# Patient Record
Sex: Female | Born: 1995 | Hispanic: No | Marital: Married | Smoking: Never smoker
Health system: Southern US, Community
[De-identification: ages and names within clinical notes are randomized; demographics above are authoritative.]

## PROBLEM LIST (undated history)

## (undated) DIAGNOSIS — E611 Iron deficiency: Secondary | ICD-10-CM

## (undated) DIAGNOSIS — M1990 Unspecified osteoarthritis, unspecified site: Secondary | ICD-10-CM

---
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
## 2014-06-05 ENCOUNTER — Emergency Department (HOSPITAL_COMMUNITY): Payer: BLUE CROSS/BLUE SHIELD

## 2014-06-05 ENCOUNTER — Emergency Department (HOSPITAL_COMMUNITY)
Admission: EM | Admit: 2014-06-05 | Discharge: 2014-06-05 | Disposition: A | Discharge status: Home / Self Care | Payer: BLUE CROSS/BLUE SHIELD | Attending: Emergency Medicine | Admitting: Emergency Medicine

## 2014-06-05 ENCOUNTER — Encounter (HOSPITAL_COMMUNITY): Payer: Self-pay | Admitting: *Deleted

## 2014-06-05 DIAGNOSIS — Z8739 Personal history of other diseases of the musculoskeletal system and connective tissue: Secondary | ICD-10-CM | POA: Diagnosis not present

## 2014-06-05 DIAGNOSIS — Y92008 Other place in unspecified non-institutional (private) residence as the place of occurrence of the external cause: Secondary | ICD-10-CM | POA: Diagnosis not present

## 2014-06-05 DIAGNOSIS — X58XXXA Exposure to other specified factors, initial encounter: Secondary | ICD-10-CM | POA: Diagnosis not present

## 2014-06-05 DIAGNOSIS — S4991XA Unspecified injury of right shoulder and upper arm, initial encounter: Secondary | ICD-10-CM | POA: Insufficient documentation

## 2014-06-05 DIAGNOSIS — Y9389 Activity, other specified: Secondary | ICD-10-CM | POA: Insufficient documentation

## 2014-06-05 DIAGNOSIS — Y998 Other external cause status: Secondary | ICD-10-CM | POA: Diagnosis not present

## 2014-06-05 HISTORY — DX: Unspecified osteoarthritis, unspecified site: M19.90

## 2014-06-05 MED ORDER — CYCLOBENZAPRINE HCL 10 MG PO TABS: 10.0000 mg | ORAL_TABLET | Freq: Two times a day (BID) | ORAL | Status: DC | PRN

## 2014-06-05 MED ORDER — HYDROCODONE-ACETAMINOPHEN 5-325 MG PO TABS
2.0000 | ORAL_TABLET | Freq: Once | ORAL | Status: AC
  Administered 2014-06-05: 2 via ORAL
  Filled 2014-06-05: qty 2

## 2014-06-05 MED ORDER — MELOXICAM 15 MG PO TABS: 15.0000 mg | ORAL_TABLET | Freq: Every day | ORAL | Status: DC

## 2014-06-05 NOTE — Discharge Instructions (Signed)
Take Mobic as needed for pain. Take flexeril as needed for muscle spasm. You may take these medications together. Refer to attached documents for more information.  °

## 2014-06-05 NOTE — ED Provider Notes (Signed)
CSN: [id number]     Arrival date & time 06/05/14  1313 History  This chart was scribed for non-physician practitioner, Emilia BeckKaitlyn Jessyca Sloan, working with Samuel JesterKathleen McManus, DO by Richarda Overlieichard Holland, ED Scribe. This patient was seen in room TR03C/TR03C and the patient's care was started at 3:38 PM.  Chief Complaint  Patient presents with  . Shoulder Pain   The history is provided by the patient. No language interpreter was used.   HPI Comments: Gabrielle Erickson is a 19 y.o. female with a history of arthritis who presents to the Emergency Department complaining of right shoulder pain for the last 2 days. Pt states she was moving to a new house when she felt a pull and heard a pop in her right shoulder while moving something. She denies any other known injuries. Pt states that she has taken ibuprofen 800mg  and naproxen at home with no relief. She states her pain worsens with certain movements such as raising her right arm upwards. She denies pain in her elbow.   Past Medical History  Diagnosis Date  . Arthritis    History reviewed. No pertinent past surgical history. No family history on file. History  Substance Use Topics  . Smoking status: Never Smoker   . Smokeless tobacco: Not on file  . Alcohol Use: Not on file   OB History    No data available     Review of Systems  Musculoskeletal: Positive for arthralgias.  All other systems reviewed and are negative.  Allergies  Review of patient's allergies indicates no known allergies.  Home Medications   Prior to Admission medications   Not on File   BP 103/81 mmHg  Pulse 68  Temp(Src) 98.2 F (36.8 C) (Oral)  Resp 16  Ht 5\' 3"  (1.6 m)  Wt 215 lb 5 oz (97.665 kg)  BMI 38.15 kg/m2  SpO2 98%  LMP 06/05/2014 Physical Exam  Constitutional: She is oriented to person, place, and time. She appears well-developed and well-nourished.  HENT:  Head: Normocephalic and atraumatic.  Eyes: Right eye exhibits no discharge. Left eye exhibits no  discharge.  Neck: Neck supple. No tracheal deviation present.  Cardiovascular: Normal rate and regular rhythm.   Pulmonary/Chest: Effort normal. No respiratory distress.  Abdominal: Soft. She exhibits no distension.  Musculoskeletal: She exhibits tenderness.  Generalized right shoulder TTP. No obvious joint deformity. Limited ROM due to pain.   Neurological: She is alert and oriented to person, place, and time. Coordination normal.  Skin: Skin is warm and dry.  Psychiatric: She has a normal mood and affect. Her behavior is normal.  Nursing note and vitals reviewed.   ED Course  Procedures   DIAGNOSTIC STUDIES: Oxygen Saturation is 98% on RA, normal by my interpretation.    COORDINATION OF CARE: 3:41 PM Discussed treatment plan with pt at bedside and pt agreed to plan.   Labs Review Labs Reviewed - No data to display  Imaging Review Dg Shoulder Right  06/05/2014   CLINICAL DATA:  Right shoulder pain since moving 2 days ago.  EXAM: RIGHT SHOULDER - 2+ VIEW  COMPARISON:  None.  FINDINGS: There is no evidence of fracture or dislocation. There is no evidence of arthropathy or other focal bone abnormality. Soft tissues are unremarkable.  IMPRESSION: Negative.   Electronically Signed   By: Charlett NoseKevin  Dover M.D.   On: 06/05/2014 15:18     EKG Interpretation None      MDM   Final diagnoses:  Right shoulder injury, initial  encounter   Xray unremarkable. Patient will have flexeril and mobic for pain. No neurovascular compromise.   I personally performed the services described in this documentation, which was scribed in my presence. The recorded information has been reviewed and is accurate.      Emilia Beck, PA-C 06/07/14 0515  Samuel Jester, DO 06/08/14 2110

## 2014-06-05 NOTE — ED Notes (Signed)
Pt states that she has had rt shoulder pain since she was moving 2 days ago. Pt states that the woke up the next morning after moving with the pain.

## 2014-06-19 ENCOUNTER — Emergency Department (HOSPITAL_COMMUNITY)
Admission: EM | Admit: 2014-06-19 | Discharge: 2014-06-19 | Disposition: A | Discharge status: Home / Self Care | Payer: BLUE CROSS/BLUE SHIELD | Attending: Emergency Medicine | Admitting: Emergency Medicine

## 2014-06-19 ENCOUNTER — Encounter (HOSPITAL_COMMUNITY): Payer: Self-pay | Admitting: Physical Medicine and Rehabilitation

## 2014-06-19 DIAGNOSIS — Z880 Allergy status to penicillin: Secondary | ICD-10-CM | POA: Insufficient documentation

## 2014-06-19 DIAGNOSIS — M25511 Pain in right shoulder: Secondary | ICD-10-CM

## 2014-06-19 MED ORDER — CYCLOBENZAPRINE HCL 10 MG PO TABS: 10.0000 mg | ORAL_TABLET | Freq: Two times a day (BID) | ORAL | Status: DC | PRN

## 2014-06-19 MED ORDER — MELOXICAM 15 MG PO TABS: 15.0000 mg | ORAL_TABLET | Freq: Every day | ORAL | Status: DC

## 2014-06-19 NOTE — ED Notes (Signed)
Pt presents to department for evaluation of R shoulder pain. Was seen for same recently. Now reports increased pain, lifts heavy equipment frequently at work. 5/10 pain upon arrival to ED. No obvious deformity noted. Pt is alert and oriented x4.

## 2014-06-19 NOTE — Discharge Instructions (Signed)
Take mobic as needed for pain. Take flexeril for muscle spasm. Follow up with Dr. Eulah PontMurphy for further evaluation.

## 2014-06-19 NOTE — ED Notes (Signed)
Patient states she didn't get a note last time restricting use of her arm, so she lifts heavy objects at work and "it hasn't healed".   Patient states that she needs note to "let me work light duty, so I don't have to use my arm".

## 2014-06-19 NOTE — ED Provider Notes (Signed)
CSN: [id number]     Arrival date & time 06/19/14  1156 History   This chart was scribed for Emilia BeckKaitlyn Zalika Tieszen, PA-C working with Bethann BerkshireJoseph Zammit, MD by Evon Slackerrance Branch, ED Scribe. This patient was seen in room TR10C/TR10C and the patient's care was started at 12:21 PM.     Chief Complaint  Patient presents with  . Shoulder Pain   Patient is a 19 y.o. female presenting with shoulder pain. The history is provided by the patient. No language interpreter was used.  Shoulder Pain  HPI Comments: Gabrielle Erickson is a 19 y.o. female who presents to the Emergency Department complaining of right shoulder pain onset 06/03/14. Pt doesn't report any associated symptoms. Pt states that the pain is worse with movement. Pt states that her shoulder is not healing due to not being able to rest her arm while at work. Pt is requesting a work note for light duty. Pt denies any other symptoms.   Past Medical History  Diagnosis Date  . Arthritis    History reviewed. No pertinent past surgical history. No family history on file. History  Substance Use Topics  . Smoking status: Never Smoker   . Smokeless tobacco: Not on file  . Alcohol Use: No   OB History    No data available      Review of Systems  Musculoskeletal: Positive for arthralgias.  All other systems reviewed and are negative.    Allergies  Penicillins  Home Medications   Prior to Admission medications   Medication Sig Start Date End Date Taking? Authorizing Provider  cyclobenzaprine (FLEXERIL) 10 MG tablet Take 1 tablet (10 mg total) by mouth 2 (two) times daily as needed for muscle spasms. 06/05/14   Kamla Skilton, PA-C  meloxicam (MOBIC) 15 MG tablet Take 1 tablet (15 mg total) by mouth daily. 06/05/14   Berl Bonfanti, PA-C   BP 105/65 mmHg  Pulse 86  Temp(Src) 98.8 F (37.1 C) (Oral)  Resp 18  SpO2 99%  LMP 06/05/2014   Physical Exam  Constitutional: She is oriented to person, place, and time. She appears  well-developed and well-nourished. No distress.  HENT:  Head: Normocephalic and atraumatic.  Eyes: Conjunctivae and EOM are normal.  Neck: Neck supple. No tracheal deviation present.  Cardiovascular: Normal rate.   Pulmonary/Chest: Effort normal. No respiratory distress.  Abdominal: Soft. She exhibits no distension. There is no tenderness. There is no rebound.  Musculoskeletal: Normal range of motion. She exhibits tenderness.  right shoulder generalized tenderness to palpation, no obvious deformity.   Neurological: She is alert and oriented to person, place, and time. Coordination normal.  Skin: Skin is warm and dry.  Psychiatric: She has a normal mood and affect. Her behavior is normal.  Nursing note and vitals reviewed.   ED Course  Procedures (including critical care time) DIAGNOSTIC STUDIES: Oxygen Saturation is 99% on RA, normal by my interpretation.    COORDINATION OF CARE: 1:24 PM-Discussed treatment plan with pt at bedside and pt agreed to plan.     Labs Review Labs Reviewed - No data to display  Imaging Review No results found.   EKG Interpretation None      MDM   Final diagnoses:  Right shoulder pain   Patient will have work note for light duty and additional pain medication. Patient will have Ortho follow up for further evaluation.    I personally performed the services described in this documentation, which was scribed in my presence. The recorded information has  been reviewed and is accurate.      Emilia BeckKaitlyn Chalyn Amescua, PA-C 06/20/14 1059  Bethann BerkshireJoseph Zammit, MD 06/20/14 1451

## 2015-09-03 ENCOUNTER — Emergency Department (HOSPITAL_COMMUNITY)
Admission: EM | Admit: 2015-09-03 | Discharge: 2015-09-04 | Disposition: A | Discharge status: Home / Self Care | Payer: BLUE CROSS/BLUE SHIELD | Attending: Emergency Medicine | Admitting: Emergency Medicine

## 2015-09-03 ENCOUNTER — Emergency Department (HOSPITAL_COMMUNITY): Payer: BLUE CROSS/BLUE SHIELD

## 2015-09-03 ENCOUNTER — Encounter (HOSPITAL_COMMUNITY): Payer: Self-pay | Admitting: Emergency Medicine

## 2015-09-03 DIAGNOSIS — Y999 Unspecified external cause status: Secondary | ICD-10-CM | POA: Diagnosis not present

## 2015-09-03 DIAGNOSIS — S4991XA Unspecified injury of right shoulder and upper arm, initial encounter: Secondary | ICD-10-CM | POA: Diagnosis not present

## 2015-09-03 DIAGNOSIS — S7012XA Contusion of left thigh, initial encounter: Secondary | ICD-10-CM | POA: Insufficient documentation

## 2015-09-03 DIAGNOSIS — Y92488 Other paved roadways as the place of occurrence of the external cause: Secondary | ICD-10-CM | POA: Insufficient documentation

## 2015-09-03 DIAGNOSIS — S79922A Unspecified injury of left thigh, initial encounter: Secondary | ICD-10-CM | POA: Diagnosis present

## 2015-09-03 DIAGNOSIS — Y9389 Activity, other specified: Secondary | ICD-10-CM | POA: Insufficient documentation

## 2015-09-03 MED ORDER — IBUPROFEN 400 MG PO TABS
600.0000 mg | ORAL_TABLET | Freq: Once | ORAL | Status: AC
  Administered 2015-09-03: 600 mg via ORAL
  Filled 2015-09-03: qty 1

## 2015-09-03 MED ORDER — HYDROCODONE-ACETAMINOPHEN 5-325 MG PO TABS: 2.0000 | ORAL_TABLET | ORAL | 0 refills | Status: DC | PRN

## 2015-09-03 MED ORDER — IBUPROFEN 600 MG PO TABS: 600.0000 mg | ORAL_TABLET | Freq: Four times a day (QID) | ORAL | 0 refills | Status: DC | PRN

## 2015-09-03 MED ORDER — METHOCARBAMOL 500 MG PO TABS: 500.0000 mg | ORAL_TABLET | Freq: Every evening | ORAL | 0 refills | Status: DC | PRN
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
## 2015-09-04 NOTE — ED Notes (Signed)
Pt verbalized understanding of d/c instructions and has no further questions. Pt stable and NAD. Pt d/c home with mother driving. Pt removed all belongings from room.

## 2015-12-29 ENCOUNTER — Emergency Department: Payer: No Typology Code available for payment source

## 2015-12-29 ENCOUNTER — Emergency Department
Admission: EM | Admit: 2015-12-29 | Discharge: 2015-12-29 | Disposition: A | Discharge status: Home / Self Care | Payer: No Typology Code available for payment source | Attending: Emergency Medicine | Admitting: Emergency Medicine

## 2015-12-29 DIAGNOSIS — Y929 Unspecified place or not applicable: Secondary | ICD-10-CM | POA: Diagnosis not present

## 2015-12-29 DIAGNOSIS — S8991XA Unspecified injury of right lower leg, initial encounter: Secondary | ICD-10-CM | POA: Diagnosis present

## 2015-12-29 DIAGNOSIS — Y999 Unspecified external cause status: Secondary | ICD-10-CM | POA: Insufficient documentation

## 2015-12-29 DIAGNOSIS — Z791 Long term (current) use of non-steroidal anti-inflammatories (NSAID): Secondary | ICD-10-CM | POA: Insufficient documentation

## 2015-12-29 DIAGNOSIS — Y9389 Activity, other specified: Secondary | ICD-10-CM | POA: Diagnosis not present

## 2015-12-29 DIAGNOSIS — S8001XA Contusion of right knee, initial encounter: Secondary | ICD-10-CM | POA: Insufficient documentation

## 2015-12-29 MED ORDER — HYDROCODONE-ACETAMINOPHEN 5-325 MG PO TABS
1.0000 | ORAL_TABLET | Freq: Once | ORAL | Status: AC
  Administered 2015-12-29: 1 via ORAL
  Filled 2015-12-29: qty 1

## 2015-12-29 MED ORDER — MELOXICAM 15 MG PO TABS: 15.0000 mg | ORAL_TABLET | Freq: Every day | ORAL | 0 refills | Status: DC
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
## 2015-12-29 NOTE — ED Notes (Signed)
Pt hold her knee out straight, good strength in knee as she was holding it up.
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
## 2016-06-10 ENCOUNTER — Emergency Department
Admission: EM | Admit: 2016-06-10 | Discharge: 2016-06-10 | Disposition: A | Discharge status: Home / Self Care | Payer: BLUE CROSS/BLUE SHIELD | Attending: Emergency Medicine | Admitting: Emergency Medicine

## 2016-06-10 ENCOUNTER — Encounter: Payer: Self-pay | Admitting: Emergency Medicine

## 2016-06-10 ENCOUNTER — Emergency Department: Payer: BLUE CROSS/BLUE SHIELD

## 2016-06-10 DIAGNOSIS — X500XXA Overexertion from strenuous movement or load, initial encounter: Secondary | ICD-10-CM | POA: Insufficient documentation

## 2016-06-10 DIAGNOSIS — Y999 Unspecified external cause status: Secondary | ICD-10-CM | POA: Insufficient documentation

## 2016-06-10 DIAGNOSIS — M7581 Other shoulder lesions, right shoulder: Secondary | ICD-10-CM

## 2016-06-10 DIAGNOSIS — Y9389 Activity, other specified: Secondary | ICD-10-CM | POA: Insufficient documentation

## 2016-06-10 DIAGNOSIS — M7591 Shoulder lesion, unspecified, right shoulder: Secondary | ICD-10-CM | POA: Insufficient documentation

## 2016-06-10 DIAGNOSIS — Y929 Unspecified place or not applicable: Secondary | ICD-10-CM | POA: Insufficient documentation

## 2016-06-10 MED ORDER — IBUPROFEN 600 MG PO TABS: 600.0000 mg | ORAL_TABLET | Freq: Three times a day (TID) | ORAL | 0 refills | Status: DC | PRN

## 2016-06-10 MED ORDER — OXYCODONE-ACETAMINOPHEN 5-325 MG PO TABS
1.0000 | ORAL_TABLET | Freq: Once | ORAL | Status: AC
  Administered 2016-06-10: 1 via ORAL
  Filled 2016-06-10: qty 1

## 2016-06-10 MED ORDER — CYCLOBENZAPRINE HCL 10 MG PO TABS: 10.0000 mg | ORAL_TABLET | Freq: Three times a day (TID) | ORAL | 0 refills | Status: DC | PRN

## 2016-06-10 MED ORDER — OXYCODONE-ACETAMINOPHEN 5-325 MG PO TABS: 1.0000 | ORAL_TABLET | Freq: Four times a day (QID) | ORAL | 0 refills | Status: DC | PRN

## 2016-06-10 MED ORDER — IBUPROFEN 600 MG PO TABS
600.0000 mg | ORAL_TABLET | Freq: Once | ORAL | Status: AC
  Administered 2016-06-10: 600 mg via ORAL
  Filled 2016-06-10: qty 1

## 2016-06-10 MED ORDER — CYCLOBENZAPRINE HCL 10 MG PO TABS
10.0000 mg | ORAL_TABLET | Freq: Once | ORAL | Status: AC
  Administered 2016-06-10: 10 mg via ORAL
  Filled 2016-06-10: qty 1

## 2016-06-10 NOTE — ED Triage Notes (Signed)
Was lifting today and felt pain R shoulder.

## 2016-06-10 NOTE — ED Notes (Signed)
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

## 2016-06-10 NOTE — ED Provider Notes (Signed)
[facility] Emergency Department Provider Note   ____________________________________________   First MD Initiated Contact with Patient 06/10/16 1347     (approximate)  I have reviewed the triage vital signs and the nursing notes.   HISTORY  Chief Complaint Shoulder Pain    HPI Gabrielle Erickson is a 21 y.o. female patient complaining of right shoulder pain. Patient states approximately 3-4 days of repetitive heavy lifting to the relocation process. Patient state this morning she was assisted her mother to move dresser as she felt a "pop" in her right shoulder. Patient stated to break and she noticed that she has decreased range of motion and increased pain with adduction or attempting to reach above her head. Patient denies loss of sensation to the shoulder. Patient rates the pain as a 9/10. Patient described a pain as "sharp". No palliative measures for this complaint. Patient is right-hand dominant. Past Medical History:  Diagnosis Date  . Arthritis     There are no active problems to display for this patient.   History reviewed. No pertinent surgical history.  Prior to Admission medications   Medication Sig Start Date End Date Taking? Authorizing Provider  cyclobenzaprine (FLEXERIL) 10 MG tablet Take 1 tablet (10 mg total) by mouth 3 (three) times daily as needed. 06/10/16   Joni Reining, PA-C  HYDROcodone-acetaminophen (NORCO/VICODIN) 5-325 MG tablet Take 2 tablets by mouth every 4 (four) hours as needed. 09/03/15   Bethel Born, PA-C  ibuprofen (ADVIL,MOTRIN) 600 MG tablet Take 1 tablet (600 mg total) by mouth every 6 (six) hours as needed. 09/03/15   Bethel Born, PA-C  ibuprofen (ADVIL,MOTRIN) 600 MG tablet Take 1 tablet (600 mg total) by mouth every 8 (eight) hours as needed. 06/10/16   Joni Reining, PA-C  meloxicam (MOBIC) 15 MG tablet Take 1 tablet (15 mg total) by mouth daily. 12/29/15   Delorise Royals Cuthriell, PA-C  methocarbamol  (ROBAXIN) 500 MG tablet Take 1 tablet (500 mg total) by mouth at bedtime and may repeat dose one time if needed. 09/03/15   Bethel Born, PA-C  oxyCODONE-acetaminophen (ROXICET) 5-325 MG tablet Take 1 tablet by mouth every 6 (six) hours as needed for moderate pain. 06/10/16   Joni Reining, PA-C    Allergies Penicillins  No family history on file.  Social History Social History  Substance Use Topics  . Smoking status: Never Smoker  . Smokeless tobacco: Not on file  . Alcohol use No    Review of Systems  Constitutional: No fever/chills Eyes: No visual changes. ENT: No sore throat. Cardiovascular: Denies chest pain. Respiratory: Denies shortness of breath. Gastrointestinal: No abdominal pain.  No nausea, no vomiting.  No diarrhea.  No constipation. Genitourinary: Negative for dysuria. Musculoskeletal: Right shoulder pain. Skin: Negative for rash. Neurological: Negative for headaches, focal weakness or numbness.   ____________________________________________   PHYSICAL EXAM:  VITAL SIGNS: ED Triage Vitals  Enc Vitals Group     BP 06/10/16 1324 129/73     Pulse Rate 06/10/16 1324 71     Resp 06/10/16 1324 20     Temp 06/10/16 1324 98.3 F (36.8 C)     Temp Source 06/10/16 1324 Oral     SpO2 06/10/16 1324 99 %     Weight 06/10/16 1326 230 lb (104.3 kg)     Height 06/10/16 1326  (1.6 m)     Head Circumference --      Peak Flow --  Pain Score 06/10/16 1324 9     Pain Loc --      Pain Edu? --      Excl. in GC? --     Constitutional: Alert and oriented. Well appearing and in no acute distress. Eyes: Conjunctivae are normal. PERRL. EOMI. Head: Atraumatic. Nose: No congestion/rhinnorhea. Mouth/Throat: Mucous membranes are moist.  Oropharynx non-erythematous. Neck: No stridor.  No cervical spine tenderness to palpation. Hematological/Lymphatic/Immunilogical: No cervical lymphadenopathy. Cardiovascular: Normal rate, regular rhythm. Grossly normal heart  sounds.  Good peripheral circulation. Respiratory: Normal respiratory effort.  No retractions. Lungs CTAB. Gastrointestinal: Soft and nontender. No distention. No abdominal bruits. No CVA tenderness. Musculoskeletal: No obvious deformity to the right shoulder. Patient hosted the upper extremities in adduction.  Neurologic:  Normal speech and language. No gross focal neurologic deficits are appreciated. No gait instability. Skin:  Skin is warm, dry and intact. No rash noted. Psychiatric: Mood and affect are normal. Speech and behavior are normal.  ____________________________________________   LABS (all labs ordered are listed, but only abnormal results are displayed)  Labs Reviewed - No data to display ____________________________________________  EKG   ____________________________________________  RADIOLOGY  No acute findings x-ray of the right shoulder ____________________________________________   PROCEDURES  Procedure(s) performed: None  Procedures  Critical Care performed: No  ____________________________________________   INITIAL IMPRESSION / ASSESSMENT AND PLAN / ED COURSE  Pertinent labs & imaging results that were available during my care of the patient were reviewed by me and considered in my medical decision making (see chart for details).  Right rotator cuff tendinitis. Discussed x-ray finding with patient. Patient given discharge care instructions. Patient given a work note. Patient advised follow-up with orthopedics if no improvement in 3-5 days.      ____________________________________________   FINAL CLINICAL IMPRESSION(S) / ED DIAGNOSES  Final diagnoses:  Tendinitis of right rotator cuff      NEW MEDICATIONS STARTED DURING THIS VISIT:  New Prescriptions   CYCLOBENZAPRINE (FLEXERIL) 10 MG TABLET    Take 1 tablet (10 mg total) by mouth 3 (three) times daily as needed.   IBUPROFEN (ADVIL,MOTRIN) 600 MG TABLET    Take 1 tablet (600 mg  total) by mouth every 8 (eight) hours as needed.   OXYCODONE-ACETAMINOPHEN (ROXICET) 5-325 MG TABLET    Take 1 tablet by mouth every 6 (six) hours as needed for moderate pain.     Note:  This document was prepared using Dragon voice recognition software and may include unintentional dictation errors.    Joni Reining, PA-C 06/10/16 1429    Governor Rooks, MD 06/10/16 1540

## 2016-09-30 ENCOUNTER — Encounter: Payer: Self-pay | Admitting: Emergency Medicine

## 2016-09-30 ENCOUNTER — Emergency Department
Admission: EM | Admit: 2016-09-30 | Discharge: 2016-09-30 | Disposition: A | Discharge status: Home / Self Care | Payer: BLUE CROSS/BLUE SHIELD | Attending: Emergency Medicine | Admitting: Emergency Medicine

## 2016-09-30 DIAGNOSIS — X58XXXA Exposure to other specified factors, initial encounter: Secondary | ICD-10-CM | POA: Insufficient documentation

## 2016-09-30 DIAGNOSIS — Y929 Unspecified place or not applicable: Secondary | ICD-10-CM | POA: Insufficient documentation

## 2016-09-30 DIAGNOSIS — S29019A Strain of muscle and tendon of unspecified wall of thorax, initial encounter: Secondary | ICD-10-CM

## 2016-09-30 DIAGNOSIS — Y939 Activity, unspecified: Secondary | ICD-10-CM | POA: Insufficient documentation

## 2016-09-30 DIAGNOSIS — Z79899 Other long term (current) drug therapy: Secondary | ICD-10-CM | POA: Insufficient documentation

## 2016-09-30 DIAGNOSIS — S29012A Strain of muscle and tendon of back wall of thorax, initial encounter: Secondary | ICD-10-CM | POA: Insufficient documentation

## 2016-09-30 DIAGNOSIS — Y998 Other external cause status: Secondary | ICD-10-CM | POA: Insufficient documentation

## 2016-09-30 MED ORDER — CYCLOBENZAPRINE HCL 10 MG PO TABS: 10.0000 mg | ORAL_TABLET | Freq: Three times a day (TID) | ORAL | 0 refills | Status: DC | PRN

## 2016-09-30 MED ORDER — ORPHENADRINE CITRATE 30 MG/ML IJ SOLN: 60.0000 mg | Freq: Two times a day (BID) | INTRAMUSCULAR | Status: DC

## 2016-09-30 MED ORDER — KETOROLAC TROMETHAMINE 60 MG/2ML IM SOLN
60.0000 mg | Freq: Once | INTRAMUSCULAR | Status: AC
  Administered 2016-09-30: 60 mg via INTRAMUSCULAR
  Filled 2016-09-30: qty 2

## 2016-09-30 MED ORDER — IBUPROFEN 600 MG PO TABS: 600.0000 mg | ORAL_TABLET | Freq: Three times a day (TID) | ORAL | 0 refills | Status: DC | PRN

## 2016-09-30 NOTE — ED Notes (Signed)
See triage note  States she has had intermittent back pain/spasms for about 3 years  Denies any injury or urinary sx's.. states she has been seen for same sx's and was told she may need breast reduction  Ambulates well

## 2016-09-30 NOTE — ED Triage Notes (Signed)
Pt reports history of low back pain and muscle spasms for three years intermittently. Pt reports increasing pain and spasms over three weeks. Denies NVD. Denies dysuria.

## 2016-09-30 NOTE — ED Notes (Signed)
First Nurse Note:  Back pain which she describes as "muscle spasms".

## 2016-09-30 NOTE — ED Provider Notes (Signed)
[facility] [place] Emergency Department Provider Note   ____________________________________________   First MD Initiated Contact with Patient 09/30/16 0900     (approximate)  I have reviewed the triage vital signs and the nursing notes.   HISTORY  Chief Complaint Back Pain    HPI Gabrielle Erickson is a 21 y.o. female patient complaining of intermittent mid and low back pain for approximately 3 years. Patient stated pain was mostly of muscle spasms. Current episode has been present for 3 weeks. Patient believes her complaint is the result of her large breasts.Patient rates pain as a 10 over 10. Patient describes pain as "achy/spasmatic". No palliative measures for complaint.   Past Medical History:  Diagnosis Date  . Arthritis     There are no active problems to display for this patient.   No past surgical history on file.  Prior to Admission medications   Medication Sig Start Date End Date Taking? Authorizing Provider  cyclobenzaprine (FLEXERIL) 10 MG tablet Take 1 tablet (10 mg total) by mouth 3 (three) times daily as needed. 06/10/16   Joni Reining, PA-C  cyclobenzaprine (FLEXERIL) 10 MG tablet Take 1 tablet (10 mg total) by mouth 3 (three) times daily as needed. 09/30/16   Joni Reining, PA-C  HYDROcodone-acetaminophen (NORCO/VICODIN) 5-325 MG tablet Take 2 tablets by mouth every 4 (four) hours as needed. 09/03/15   Bethel Born, PA-C  ibuprofen (ADVIL,MOTRIN) 600 MG tablet Take 1 tablet (600 mg total) by mouth every 6 (six) hours as needed. 09/03/15   Bethel Born, PA-C  ibuprofen (ADVIL,MOTRIN) 600 MG tablet Take 1 tablet (600 mg total) by mouth every 8 (eight) hours as needed. 06/10/16   Joni Reining, PA-C  ibuprofen (ADVIL,MOTRIN) 600 MG tablet Take 1 tablet (600 mg total) by mouth every 8 (eight) hours as needed. 09/30/16   Joni Reining, PA-C  meloxicam (MOBIC) 15 MG tablet Take 1 tablet (15 mg total) by mouth daily. 12/29/15    Cuthriell, Delorise Royals, PA-C  methocarbamol (ROBAXIN) 500 MG tablet Take 1 tablet (500 mg total) by mouth at bedtime and may repeat dose one time if needed. 09/03/15   Bethel Born, PA-C  oxyCODONE-acetaminophen (ROXICET) 5-325 MG tablet Take 1 tablet by mouth every 6 (six) hours as needed for moderate pain. 06/10/16   Joni Reining, PA-C    Allergies Penicillins  No family history on file.  Social History Social History  Substance Use Topics  . Smoking status: Never Smoker  . Smokeless tobacco: Not on file  . Alcohol use No    Review of Systems  Constitutional: No fever/chills Eyes: No visual changes. ENT: No sore throat. Cardiovascular: Denies chest pain. Respiratory: Denies shortness of breath. Gastrointestinal: No abdominal pain.  No nausea, no vomiting.  No diarrhea.  No constipation. Genitourinary: Negative for dysuria. Musculoskeletal: Positive for back pain. Skin: Negative for rash. Neurological: Negative for headaches, focal weakness or numbness. Allergic/Immunilogical: Penicillin  ____________________________________________   PHYSICAL EXAM:  VITAL SIGNS: ED Triage Vitals  Enc Vitals Group     BP 09/30/16 0830 120/71     Pulse Rate 09/30/16 0830 64     Resp 09/30/16 0830 16     Temp 09/30/16 0830 98.7 F (37.1 C)     Temp Source 09/30/16 0830 Oral     SpO2 09/30/16 0830 98 %     Weight 09/30/16 0831 240 lb (108.9 kg)     Height 09/30/16 0831 5\' 2"  (1.575 m)  Head Circumference --      Peak Flow --      Pain Score 09/30/16 0836 10     Pain Loc --      Pain Edu? --      Excl. in GC? --     Constitutional: Alert and oriented. Well appearing and in no acute distress. Neck: No stridor.  No cervical spine tenderness to palpation. Cardiovascular: Normal rate, regular rhythm. Grossly normal heart sounds.  Good peripheral circulation. Respiratory: Normal respiratory effort.  No retractions. Lungs CTAB. Gastrointestinal: Soft and nontender. No  distention. No abdominal bruits. No CVA tenderness. Musculoskeletal: Normal respiratory no guarding palpation spinal processes. Patient has moderate guarding palpation of the inferior scapular and bilateral thoracic muscles. Neurologic:  Normal speech and language. No gross focal neurologic deficits are appreciated. No gait instability. Skin:  Skin is warm, dry and intact. No rash noted. Psychiatric: Mood and affect are normal. Speech and behavior are normal.  ____________________________________________   LABS (all labs ordered are listed, but only abnormal results are displayed)  Labs Reviewed - No data to display ____________________________________________  EKG   ____________________________________________  RADIOLOGY  No results found.  ____________________________________________   PROCEDURES  Procedure(s) performed:   Procedures  Critical Care performed: No  ____________________________________________   INITIAL IMPRESSION / ASSESSMENT AND PLAN / ED COURSE  Pertinent labs & imaging results that were available during my care of the patient were reviewed by me and considered in my medical decision making (see chart for details).  Thoracic muscle strain. Patient given discharge care instructions. Patient advises to have his care with PCP for continual treatment. Given work excuse.      ____________________________________________   FINAL CLINICAL IMPRESSION(S) / ED DIAGNOSES  Final diagnoses:  Thoracic myofascial strain, initial encounter      NEW MEDICATIONS STARTED DURING THIS VISIT:  New Prescriptions   CYCLOBENZAPRINE (FLEXERIL) 10 MG TABLET    Take 1 tablet (10 mg total) by mouth 3 (three) times daily as needed.   IBUPROFEN (ADVIL,MOTRIN) 600 MG TABLET    Take 1 tablet (600 mg total) by mouth every 8 (eight) hours as needed.     Note:  This document was prepared using Dragon voice recognition software and may include unintentional dictation  errors.    Joni Reining, PA-C 09/30/16 0918    Joni Reining, PA-C 09/30/16 0919    Emily Filbert, MD 09/30/16 [phone]

## 2016-10-01 ENCOUNTER — Encounter: Payer: Self-pay | Admitting: Medical Oncology

## 2016-10-01 ENCOUNTER — Emergency Department
Admission: EM | Admit: 2016-10-01 | Discharge: 2016-10-01 | Disposition: A | Discharge status: Home / Self Care | Payer: BLUE CROSS/BLUE SHIELD | Attending: Emergency Medicine | Admitting: Emergency Medicine

## 2016-10-01 DIAGNOSIS — G8929 Other chronic pain: Secondary | ICD-10-CM | POA: Insufficient documentation

## 2016-10-01 DIAGNOSIS — M62830 Muscle spasm of back: Secondary | ICD-10-CM | POA: Insufficient documentation

## 2016-10-01 LAB — URINALYSIS, COMPLETE (UACMP) WITH MICROSCOPIC
BACTERIA UA: NONE SEEN
BILIRUBIN URINE: NEGATIVE
Glucose, UA: NEGATIVE mg/dL
HGB URINE DIPSTICK: NEGATIVE
KETONES UR: NEGATIVE mg/dL
LEUKOCYTES UA: NEGATIVE
Nitrite: NEGATIVE
Protein, ur: NEGATIVE mg/dL
Specific Gravity, Urine: 1.019 (ref 1.005–1.030)
pH: 6 (ref 5.0–8.0)

## 2016-10-01 LAB — POCT PREGNANCY, URINE: PREG TEST UR: NEGATIVE

## 2016-10-01 MED ORDER — METHOCARBAMOL 500 MG PO TABS
500.0000 mg | ORAL_TABLET | Freq: Once | ORAL | Status: AC
  Administered 2016-10-01: 500 mg via ORAL
  Filled 2016-10-01 (×2): qty 1

## 2016-10-01 MED ORDER — IBUPROFEN 600 MG PO TABS: 600.0000 mg | ORAL_TABLET | Freq: Four times a day (QID) | ORAL | 0 refills | Status: DC | PRN

## 2016-10-01 MED ORDER — KETOROLAC TROMETHAMINE 30 MG/ML IJ SOLN
30.0000 mg | Freq: Once | INTRAMUSCULAR | Status: AC
  Administered 2016-10-01: 30 mg via INTRAMUSCULAR
  Filled 2016-10-01: qty 1

## 2016-10-01 MED ORDER — ACETAMINOPHEN 500 MG PO TABS
1000.0000 mg | ORAL_TABLET | Freq: Once | ORAL | Status: AC
  Administered 2016-10-01: 1000 mg via ORAL
  Filled 2016-10-01: qty 2

## 2016-10-01 MED ORDER — METHOCARBAMOL 500 MG PO TABS: 500.0000 mg | ORAL_TABLET | Freq: Four times a day (QID) | ORAL | 0 refills | Status: DC | PRN

## 2016-10-01 MED ORDER — METHOCARBAMOL 1000 MG/10ML IJ SOLN
1000.0000 mg | Freq: Once | INTRAMUSCULAR | Status: DC
  Filled 2016-10-01: qty 10

## 2016-10-01 NOTE — ED Notes (Signed)
Dr Veronese at bedside at this time.  

## 2016-10-01 NOTE — ED Provider Notes (Signed)
[facility] Emergency Department Provider Note  ____________________________________________  Time seen: Approximately 2:05 PM  I have reviewed the triage vital signs and the nursing notes.   HISTORY  Chief Complaint Back Pain   HPI Gabrielle Erickson is a 21 y.o. female with a history of chronic back pain who presents for exacerbation of her pain. Patient is complaining of muscle spasms, located in her bilateral lower thoracic/upper lumbar area. She woke up with this pain yesterday She was seen here and was discharged home on Flexeril and ibuprofen which she took without improvement. She reports that the pain is worse with movement or after laying down in the sitting position for a while. No trauma, no saddle anesthesia, no urinary or bowel incontinence or retention, no weakness or numbness of her extremities, no fever or chills, no dysuria hematuria, no abdominal pain,paresthesias, no chest pain or shortness of breath. Patient reports that she has had these symptoms for 4 years. She has tried physical therapy with no relief.she was told that she needs breast reduction surgery and weight loss to be able to have her pain under control. She is waiting for insurance approval.  Past Medical History:  Diagnosis Date  . Arthritis     There are no active problems to display for this patient.   No past surgical history on file.  Prior to Admission medications   Medication Sig Start Date End Date Taking? Authorizing Provider  HYDROcodone-acetaminophen (NORCO/VICODIN) 5-325 MG tablet Take 2 tablets by mouth every 4 (four) hours as needed. 09/03/15   Bethel Born, PA-C  ibuprofen (ADVIL,MOTRIN) 600 MG tablet Take 1 tablet (600 mg total) by mouth every 6 (six) hours as needed. 10/01/16   Nita Sickle, MD  methocarbamol (ROBAXIN) 500 MG tablet Take 1 tablet (500 mg total) by mouth every 6 (six) hours as needed for muscle spasms. 10/01/16   Nita Sickle, MD    oxyCODONE-acetaminophen (ROXICET) 5-325 MG tablet Take 1 tablet by mouth every 6 (six) hours as needed for moderate pain. 06/10/16   Joni Reining, PA-C    Allergies Penicillins  No family history on file.  Social History Social History  Substance Use Topics  . Smoking status: Never Smoker  . Smokeless tobacco: Not on file  . Alcohol use No    Review of Systems  Constitutional: Negative for fever. Eyes: Negative for visual changes. ENT: Negative for sore throat. Neck: No neck pain  Cardiovascular: Negative for chest pain. Respiratory: Negative for shortness of breath. Gastrointestinal: Negative for abdominal pain, vomiting or diarrhea. Genitourinary: Negative for dysuria. Musculoskeletal: + back pain. Skin: Negative for rash. Neurological: Negative for headaches, weakness or numbness. Psych: No SI or HI  ____________________________________________   PHYSICAL EXAM:  VITAL SIGNS: ED Triage Vitals  Enc Vitals Group     BP 10/01/16 1229 116/69     Pulse Rate 10/01/16 1229 69     Resp 10/01/16 1229 16     Temp 10/01/16 1229 98.5 F (36.9 C)     Temp Source 10/01/16 1229 Oral     SpO2 10/01/16 1229 98 %     Weight 10/01/16 1224 240 lb (108.9 kg)     Height 10/01/16 1224 5\' 2"  (1.575 m)     Head Circumference --      Peak Flow --      Pain Score 10/01/16 1223 10     Pain Loc --      Pain Edu? --  Excl. in GC? --     Constitutional: Alert and oriented. Well appearing and in no apparent distress. HEENT:      Head: Normocephalic and atraumatic.         Eyes: Conjunctivae are normal. Sclera is non-icteric.       Mouth/Throat: Mucous membranes are moist.       Neck: Supple with no signs of meningismus. Cardiovascular: Regular rate and rhythm. No murmurs, gallops, or rubs. 2+ symmetrical distal pulses are present in all extremities. No JVD. Respiratory: Normal respiratory effort. Lungs are clear to auscultation bilaterally. No wheezes, crackles, or rhonchi.   Gastrointestinal: Soft, non tender, and non distended with positive bowel sounds. No rebound or guarding. Genitourinary: ttp over the bilateral flank are Musculoskeletal: No c/t/l spine ttp. Nontender with normal range of motion in all extremities. No edema, cyanosis, or erythema of extremities. Neurologic: Normal speech and language. Face is symmetric. Moving all extremities. No gross focal neurologic deficits are appreciated. DTR s normal for b/l LE Skin: Skin is warm, dry and intact. No rash noted. Psychiatric: Mood and affect are normal. Speech and behavior are normal.  ____________________________________________   LABS (all labs ordered are listed, but only abnormal results are displayed)  Labs Reviewed  URINALYSIS, COMPLETE (UACMP) WITH MICROSCOPIC - Abnormal; Notable for the following:       Result Value   Color, Urine YELLOW (*)    APPearance HAZY (*)    Squamous Epithelial / LPF 6-30 (*)    All other components within normal limits  POCT PREGNANCY, URINE   ____________________________________________  EKG  none  ____________________________________________  RADIOLOGY  none  ____________________________________________   PROCEDURES  Procedure(s) performed: None Procedures Critical Care performed:  None ____________________________________________   INITIAL IMPRESSION / ASSESSMENT AND PLAN / ED COURSE   21 y.o. female with a history of chronic back pain who presents for exacerbation of muscle spasms of her back. Patient extremely well appearing, neurologically intact,ambulating without difficulty. Normal vital signs. Patient has tenderness to palpation over the muscles of her back with no midline tenderness to palpation. Urinalysis is no evidence of UTI. Pregnancy test negative. Patient's currently discharged home on Tylenol, ibuprofen, and Robaxin.Patient given muscle strengthening exercises for her back. Recommend by mouth hydration.     Pertinent labs &  imaging results that were available during my care of the patient were reviewed by me and considered in my medical decision making (see chart for details).    ____________________________________________   FINAL CLINICAL IMPRESSION(S) / ED DIAGNOSES  Final diagnoses:  Muscle spasm of back      NEW MEDICATIONS STARTED DURING THIS VISIT:  New Prescriptions   IBUPROFEN (ADVIL,MOTRIN) 600 MG TABLET    Take 1 tablet (600 mg total) by mouth every 6 (six) hours as needed.   METHOCARBAMOL (ROBAXIN) 500 MG TABLET    Take 1 tablet (500 mg total) by mouth every 6 (six) hours as needed for muscle spasms.     Note:  This document was prepared using Dragon voice recognition software and may include unintentional dictation errors.    Don Perking, [place], MD 10/01/16 [phone]

## 2016-10-01 NOTE — Discharge Instructions (Signed)
You have been seen in the Emergency Department (ED)  today for back pain.  Back pain has many possible causes some are related to muscles while others have more serious causes. Even though you were checked carefully today and your exam and evaluation were reassuring, problems may develop later or continue to unfold. Therefore it is imperative that you follow up with doctor closely for further evaluation.  Follow-up with your doctor in 1 day for further evaluation.  For pain control take: For pain take 1000mg  of tylenol every 8 hours, 600 mg of ibuprofen every 6 hours with meals, and robaxin every 6 hours as needed.   When should you call for help?  Call your doctor now or seek immediate medical care if:  You have new or worsening numbness in your legs.  You have new or worsening weakness in your legs. (This could make it hard to stand up.)  You lose control of your bladder or bowels or if you are unable to urinate. You have numbness of your groin or buttock region If you develop a fever  Watch closely for changes in your health, and be sure to contact your doctor if:  Your pain gets worse.  You are not getting better after 2 weeks.  How can you care for yourself at home?  Take pain medicines exactly as directed.  If the doctor gave you a prescription medicine for pain, take it as prescribed.  If you are not taking a prescription pain medicine, ask your doctor if you can take an over-the-counter medicine like tylenol or ibuprofen. Sit or lie in positions that are most comfortable and reduce your pain. Try one of these positions when you lie down:  Lie on your back with your knees bent and supported by large pillows.  Lie on the floor with your legs on the seat of a sofa or chair.  Lie on your side with your knees and hips bent and a pillow between your legs.  Lie on your stomach if it does not make pain worse. Do not sit up in bed, and avoid soft couches and twisted positions. Bed rest can  help relieve pain at first, but it delays healing. Avoid bed rest after the first day of back pain.  Change positions every 30 minutes. If you must sit for long periods of time, take breaks from sitting. Get up and walk around, or lie in a comfortable position.  Try using a heating pad on a low or medium setting for 15 to 20 minutes every 2 or 3 hours. Try a warm shower in place of one session with the heating pad.  You can also try an ice pack for 10 to 15 minutes every 2 to 3 hours. Put a thin cloth between the ice pack and your skin.  Take short walks several times a day. You can start with 5 to 10 minutes, 3 or 4 times a day, and work up to longer walks. Walk on level surfaces and avoid hills and stairs until your back is better.  Return to work and other activities as soon as you can. Continued rest without activity is usually not good for your back.  To prevent future back pain, do exercises to stretch and strengthen your back and stomach. Learn how to use good posture, safe lifting techniques, and proper body mechanics.

## 2016-10-01 NOTE — ED Triage Notes (Signed)
Pt reports lower back pain that is chronic in nature. Pt denies new injury. Ambulatory.

## 2016-12-16 ENCOUNTER — Encounter: Payer: Self-pay | Admitting: Intensive Care

## 2016-12-16 ENCOUNTER — Emergency Department
Admission: EM | Admit: 2016-12-16 | Discharge: 2016-12-16 | Disposition: A | Discharge status: Home / Self Care | Payer: BLUE CROSS/BLUE SHIELD | Attending: Emergency Medicine | Admitting: Emergency Medicine

## 2016-12-16 DIAGNOSIS — Z79899 Other long term (current) drug therapy: Secondary | ICD-10-CM | POA: Insufficient documentation

## 2016-12-16 DIAGNOSIS — H60311 Diffuse otitis externa, right ear: Secondary | ICD-10-CM | POA: Insufficient documentation

## 2016-12-16 DIAGNOSIS — H9201 Otalgia, right ear: Secondary | ICD-10-CM

## 2016-12-16 HISTORY — DX: Iron deficiency: E61.1

## 2016-12-16 MED ORDER — TRAMADOL HCL 50 MG PO TABS
50.0000 mg | ORAL_TABLET | Freq: Once | ORAL | Status: AC
  Administered 2016-12-16: 50 mg via ORAL
  Filled 2016-12-16: qty 1

## 2016-12-16 MED ORDER — TRAMADOL HCL 50 MG PO TABS: 50.0000 mg | ORAL_TABLET | Freq: Four times a day (QID) | ORAL | 0 refills | Status: DC | PRN

## 2016-12-16 MED ORDER — NEOMYCIN-POLYMYXIN-HC 3.5-10000-1 OT SOLN: 3.0000 [drp] | OTIC | 0 refills | Status: AC

## 2016-12-16 MED ORDER — IBUPROFEN 800 MG PO TABS
800.0000 mg | ORAL_TABLET | Freq: Once | ORAL | Status: AC
  Administered 2016-12-16: 800 mg via ORAL
  Filled 2016-12-16: qty 1

## 2016-12-16 MED ORDER — IBUPROFEN 800 MG PO TABS: 800.0000 mg | ORAL_TABLET | Freq: Three times a day (TID) | ORAL | 0 refills | Status: DC | PRN

## 2016-12-16 MED ORDER — PSEUDOEPHEDRINE HCL ER 120 MG PO TB12: 120.0000 mg | ORAL_TABLET | Freq: Two times a day (BID) | ORAL | 0 refills | Status: DC

## 2016-12-16 NOTE — ED Triage Notes (Signed)
Patient reports Chronis R ear pain that radiates across forehead X3 days.Pain has gotten worse the last three days. Pt stated "I was told when I was little I have some meat missing from my ear and it can cause pain" States she has been putting sweet oil in ear to help with pain

## 2016-12-16 NOTE — ED Provider Notes (Signed)
[facility] Emergency Department Provider Note   ____________________________________________   First MD Initiated Contact with Patient 12/16/16 [phone]     (approximate)  I have reviewed the triage vital signs and the nursing notes.   HISTORY  Chief Complaint Otalgia (right)    HPI Gabrielle Erickson is a 21 y.o. female patient complaining of 3 days of right ear pain. Patient describes the pain as "achy: Patient denies hearing loss this complaint. Patient also state she feel facial pressure.Patient rates the pain as 8/10. No relief with over-the-counter medications.   Past Medical History:  Diagnosis Date  . Arthritis   . Iron deficiency     There are no active problems to display for this patient.   History reviewed. No pertinent surgical history.  Prior to Admission medications   Medication Sig Start Date End Date Taking? Authorizing Provider  HYDROcodone-acetaminophen (NORCO/VICODIN) 5-325 MG tablet Take 2 tablets by mouth every 4 (four) hours as needed. 09/03/15   Bethel BornGekas, Kelly Marie, PA-C  ibuprofen (ADVIL,MOTRIN) 600 MG tablet Take 1 tablet (600 mg total) by mouth every 6 (six) hours as needed. 10/01/16   Nita SickleVeronese, Vera, MD  ibuprofen (ADVIL,MOTRIN) 800 MG tablet Take 1 tablet (800 mg total) every 8 (eight) hours as needed by mouth for moderate pain. 12/16/16   Joni ReiningSmith, Virgle Arth K, PA-C  methocarbamol (ROBAXIN) 500 MG tablet Take 1 tablet (500 mg total) by mouth every 6 (six) hours as needed for muscle spasms. 10/01/16   Nita SickleVeronese, [place], MD  neomycin-polymyxin-hydrocortisone (CORTISPORIN) OTIC solution Place 3 drops every 4 (four) hours for 10 days into the right ear. 12/16/16 12/26/16  Joni ReiningSmith, Tonia Avino K, PA-C  oxyCODONE-acetaminophen (ROXICET) 5-325 MG tablet Take 1 tablet by mouth every 6 (six) hours as needed for moderate pain. 06/10/16   Joni ReiningSmith, Malaney Mcbean K, PA-C  pseudoephedrine (SUDAFED) 120 MG 12 hr tablet Take 1 tablet (120 mg total) 2 (two) times  daily by mouth. 12/16/16 12/16/17  Joni ReiningSmith, Malayasia Mirkin K, PA-C  traMADol (ULTRAM) 50 MG tablet Take 1 tablet (50 mg total) every 6 (six) hours as needed by mouth for moderate pain. 12/16/16   Joni ReiningSmith, Alleigh Mollica K, PA-C    Allergies Penicillins  History reviewed. No pertinent family history.  Social History Social History   Tobacco Use  . Smoking status: Never Smoker  Substance Use Topics  . Alcohol use: No  . Drug use: Not on file    Review of Systems Constitutional: No fever/chills Eyes: No visual changes. ENT: No sore throat. Right hip pain Cardiovascular: Denies chest pain. Respiratory: Denies shortness of breath. Gastrointestinal: No abdominal pain.  No nausea, no vomiting.  No diarrhea.  No constipation. Genitourinary: Negative for dysuria. Musculoskeletal: Negative for back pain. Skin: Negative for rash. Neurological: Negative for headaches, focal weakness or numbness. Allergic/Immunilogical: Penicillin ____________________________________________   PHYSICAL EXAM:  VITAL SIGNS: ED Triage Vitals  Enc Vitals Group     BP 12/16/16 0723 (!) 121/37     Pulse Rate 12/16/16 0723 66     Resp 12/16/16 0723 18     Temp 12/16/16 0723 98.1 F (36.7 C)     Temp Source 12/16/16 0723 Oral     SpO2 12/16/16 0723 100 %     Weight 12/16/16 0721 244 lb (110.7 kg)     Height 12/16/16 0721 5\' 3"  (1.6 m)     Head Circumference --      Peak Flow --      Pain Score 12/16/16 0720 9  Pain Loc --      Pain Edu? --      Excl. in GC? --     Constitutional: Alert and oriented. Well appearing and in no acute distress. Eyes: Conjunctivae are normal. PERRL. EOMI. Head: Atraumatic. Nose: No congestion/rhinnorhea. EARS: Right edematous ear canal. Mouth/Throat: Mucous membranes are moist.  Oropharynx non-erythematous. Neck: No stridor.   Hematological/Lymphatic/Immunilogical: No cervical lymphadenopathy. Cardiovascular: Normal rate, regular rhythm. Grossly normal heart sounds.  Good peripheral  circulation. Respiratory: Normal respiratory effort.  No retractions. Lungs CTAB. Neurologic:  Normal speech and language. No gross focal neurologic deficits are appreciated. No gait instability. Skin:  Skin is warm, dry and intact. No rash noted. Psychiatric: Mood and affect are normal. Speech and behavior are normal.  ____________________________________________   LABS (all labs ordered are listed, but only abnormal results are displayed)  Labs Reviewed - No data to display ____________________________________________  EKG   ____________________________________________  RADIOLOGY  No results found.  ____________________________________________   PROCEDURES  Procedure(s) performed: None  Procedures  Critical Care performed: No  ____________________________________________   INITIAL IMPRESSION / ASSESSMENT AND PLAN / ED COURSE  As part of my medical decision making, I reviewed the following data within the electronic MEDICAL RECORD NUMBER    Otalgia secondary to right otitis external. Patient given discharge care instruction a work excuse. Patient was take medication as directed. Patient advised to follow-up with the ENT clinic if condition persists.      ____________________________________________   FINAL CLINICAL IMPRESSION(S) / ED DIAGNOSES  Final diagnoses:  Acute diffuse otitis externa of right ear  Otalgia of right ear     ED Discharge Orders        Ordered    neomycin-polymyxin-hydrocortisone (CORTISPORIN) OTIC solution  Every 4 hours     12/16/16 0734    traMADol (ULTRAM) 50 MG tablet  Every 6 hours PRN     12/16/16 0734    ibuprofen (ADVIL,MOTRIN) 800 MG tablet  Every 8 hours PRN     12/16/16 0734    pseudoephedrine (SUDAFED) 120 MG 12 hr tablet  2 times daily     12/16/16 0734       Note:  This document was prepared using Dragon voice recognition software and may include unintentional dictation errors.    Joni ReiningSmith, Linc Renne K,  PA-C 12/16/16 [id number]    Emily FilbertWilliams, Jonathan E, MD 12/16/16 1017

## 2016-12-20 ENCOUNTER — Other Ambulatory Visit: Payer: Self-pay

## 2016-12-20 ENCOUNTER — Emergency Department
Admission: EM | Admit: 2016-12-20 | Discharge: 2016-12-20 | Disposition: A | Discharge status: Home / Self Care | Payer: BLUE CROSS/BLUE SHIELD | Attending: Emergency Medicine | Admitting: Emergency Medicine

## 2016-12-20 DIAGNOSIS — H60311 Diffuse otitis externa, right ear: Secondary | ICD-10-CM

## 2016-12-20 DIAGNOSIS — H66001 Acute suppurative otitis media without spontaneous rupture of ear drum, right ear: Secondary | ICD-10-CM | POA: Insufficient documentation

## 2016-12-20 MED ORDER — HYDROCODONE-ACETAMINOPHEN 5-325 MG PO TABS
1.0000 | ORAL_TABLET | Freq: Once | ORAL | Status: AC
  Administered 2016-12-20: 1 via ORAL
  Filled 2016-12-20: qty 1

## 2016-12-20 MED ORDER — ERYTHROMYCIN BASE 500 MG PO TABS: 500.0000 mg | ORAL_TABLET | Freq: Three times a day (TID) | ORAL | 0 refills | Status: AC

## 2016-12-20 MED ORDER — HYDROCODONE-ACETAMINOPHEN 5-325 MG PO TABS
1.0000 | ORAL_TABLET | ORAL | 0 refills | Status: DC | PRN
Start: 2016-12-20 — End: 2017-10-19

## 2016-12-20 NOTE — ED Provider Notes (Signed)
[facility] Emergency Department Provider Note ____________________________________________  Time seen: Approximately 9:45 AM  I have reviewed the triage vital signs and the nursing notes.   HISTORY  Chief Complaint Otalgia    HPI Gabrielle Erickson is a 21 y.o. female who presents to the emergency department for treatment and evaluation of right ear pain. She was evaluated here about 3 days ago and diagnosed with otitis externa and given prescription for eardrops. She states that the drops have not improved her pain and it seems to actually be getting worse. She states that she may have had a fever last night but didn't take her temperature.She denies hearing loss or drainage from the ear.  Past Medical History:  Diagnosis Date  . Arthritis   . Iron deficiency     There are no active problems to display for this patient.   History reviewed. No pertinent surgical history.  Prior to Admission medications   Medication Sig Start Date End Date Taking? Authorizing Provider  erythromycin base (E-MYCIN) 500 MG tablet Take 1 tablet (500 mg total) 3 (three) times daily for 10 days by mouth. 12/20/16 12/30/16  Guinevere Stephenson B, FNP  HYDROcodone-acetaminophen (NORCO/VICODIN) 5-325 MG tablet Take 1 tablet every 4 (four) hours as needed by mouth for moderate pain. 12/20/16 12/20/17  Atley Scarboro, Kasandra Knudsenari B, FNP  ibuprofen (ADVIL,MOTRIN) 600 MG tablet Take 1 tablet (600 mg total) by mouth every 6 (six) hours as needed. 10/01/16   Nita SickleVeronese, [place], MD  ibuprofen (ADVIL,MOTRIN) 800 MG tablet Take 1 tablet (800 mg total) every 8 (eight) hours as needed by mouth for moderate pain. 12/16/16   Joni ReiningSmith, Ronald K, PA-C  methocarbamol (ROBAXIN) 500 MG tablet Take 1 tablet (500 mg total) by mouth every 6 (six) hours as needed for muscle spasms. 10/01/16   Nita SickleVeronese, [place], MD  neomycin-polymyxin-hydrocortisone (CORTISPORIN) OTIC solution Place 3 drops every 4 (four) hours for 10 days into the  right ear. 12/16/16 12/26/16  Joni ReiningSmith, Ronald K, PA-C  oxyCODONE-acetaminophen (ROXICET) 5-325 MG tablet Take 1 tablet by mouth every 6 (six) hours as needed for moderate pain. 06/10/16   Joni ReiningSmith, Ronald K, PA-C  pseudoephedrine (SUDAFED) 120 MG 12 hr tablet Take 1 tablet (120 mg total) 2 (two) times daily by mouth. 12/16/16 12/16/17  Joni ReiningSmith, Ronald K, PA-C  traMADol (ULTRAM) 50 MG tablet Take 1 tablet (50 mg total) every 6 (six) hours as needed by mouth for moderate pain. 12/16/16   Joni ReiningSmith, Ronald K, PA-C    Allergies Penicillins  No family history on file.  Social History Social History   Tobacco Use  . Smoking status: Never Smoker  . Smokeless tobacco: Never Used  Substance Use Topics  . Alcohol use: No  . Drug use: No    Review of Systems Constitutional: Positive for fever. Negative for decreased ability to hear from right/left ear(s). Eyes: Negative for discharge or drainage. ENT:       Positive for otalgia in right ear(s).      Negative for rhinorrhea or congestion.      Negative for sore throat. Gastrointestinal: Negative for nausea, vomiting, or diarrhea. Musculoskeletal: Negative for myalgias. Skin: Negative for rash, lesions, or wounds. Neurological: Negative for paresthesias. ____________________________________________   PHYSICAL EXAM:  VITAL SIGNS: ED Triage Vitals  Enc Vitals Group     BP 12/20/16 0922 106/65     Pulse Rate 12/20/16 0922 64     Resp 12/20/16 0922 17     Temp 12/20/16 0922 97.8 F (36.6  C)     Temp Source 12/20/16 0922 Oral     SpO2 12/20/16 0922 98 %     Weight 12/20/16 0919 240 lb (108.9 kg)     Height 12/20/16 0919 5\' 3"  (1.6 m)     Head Circumference --      Peak Flow --      Pain Score 12/20/16 0919 9     Pain Loc --      Pain Edu? --      Excl. in GC? --     Constitutional: Uncomfortable appearing. Tearful. Eyes: Conjunctivae are clear without discharge or drainage. Ears:       Right TM appears erythematous and bulging. EAC is  patent but does have some edema and erythema.      Left TM appears normal. Head: Atraumatic. Nose: No rhinorrhea or sinus pain on percussion. Mouth/Throat: Oropharynx appears normal. Tonsils not visualized and without exudate. Hematological/Lymphatic/Immunilogical: No palpable anterior cervical lymphadenopathy. Cardiovascular: Heart rate and rhythm are regular without murmur, gallop, or rub appreciated. Respiratory: Breath sounds are clear throughout to auscultation.  Neurologic:  Alert and oriented x 4. Skin: Intact and without rash, lesion, or wound on exposed skin surfaces. ____________________________________________   LABS (all labs ordered are listed, but only abnormal results are displayed)  Labs Reviewed - No data to display ____________________________________________   RADIOLOGY  Not indicated ____________________________________________   PROCEDURES  Procedure(s) performed: None  ____________________________________________   INITIAL IMPRESSION / ASSESSMENT AND PLAN / ED COURSE  21 year old female presenting to the emergency department for a second evaluation of right ear pain. Patient is very tearful in the emergency department and states that the otalgia has worsened over the past 24 hours. In addition to the Cortisporin drops that were prescribed on 12/16/2016, she will be given a prescription for erythromycin and Norco. She was encouraged to call and schedule a follow-up appointment with the ENT specialist if not improving over the next 24-48 hours. She was instructed to return to the emergency department for symptoms that change or worsen if she is unable schedule an appointment with either her primary care provider or the specialist.  Pertinent labs & imaging results that were available during my care of the patient were reviewed by me and considered in my medical decision making (see chart for details). ____________________________________________   FINAL  CLINICAL IMPRESSION(S) / ED DIAGNOSES  Final diagnoses:  Acute diffuse otitis externa of right ear  Acute suppurative otitis media of right ear without spontaneous rupture of tympanic membrane, recurrence not specified    If controlled substance prescribed during this visit, 12 month history viewed on the [place] prior to issuing an initial prescription for Schedule II or III opiod.   Note:  This document was prepared using Dragon voice recognition software and may include unintentional dictation errors.     Chinita Pesterriplett, Marjarie Mcbeth B, FNP 12/21/16 1050    Merrily Brittleifenbark, Neil, MD 12/23/16 [phone]

## 2016-12-20 NOTE — Discharge Instructions (Signed)
Please follow up with the ear nose and throat specialist for symptoms that are not improving over the next 2-3 days. Return to the emergency department for symptoms that change or worsen if you are unable to see primary care or the specialist.

## 2016-12-20 NOTE — ED Triage Notes (Signed)
Pt arrives to ED via POV from home with c/o continued RIGHT ear pain. Pt seen and r/x'd ear drops at this facility about 3 days ago, pt reports compliance with medications without relief of s/x's.

## 2017-01-03 ENCOUNTER — Emergency Department
Admission: EM | Admit: 2017-01-03 | Discharge: 2017-01-03 | Disposition: A | Discharge status: Home / Self Care | Payer: Self-pay | Attending: Emergency Medicine | Admitting: Emergency Medicine

## 2017-01-03 ENCOUNTER — Other Ambulatory Visit: Payer: Self-pay

## 2017-01-03 DIAGNOSIS — H60313 Diffuse otitis externa, bilateral: Secondary | ICD-10-CM | POA: Insufficient documentation

## 2017-01-03 MED ORDER — NAPROXEN 500 MG PO TABS
500.0000 mg | ORAL_TABLET | Freq: Once | ORAL | Status: AC
  Administered 2017-01-03: 500 mg via ORAL
  Filled 2017-01-03: qty 1

## 2017-01-03 MED ORDER — CIPROFLOXACIN-DEXAMETHASONE 0.3-0.1 % OT SUSP: 4.0000 [drp] | Freq: Two times a day (BID) | OTIC | 0 refills | Status: AC

## 2017-01-03 NOTE — ED Provider Notes (Signed)
[facility] Emergency Department Provider Note  ____________________________________________  Time seen: Approximately 10:56 PM  I have reviewed the triage vital signs and the nursing notes.   HISTORY  Chief Complaint Otalgia    HPI Gabrielle Erickson is a 21 y.o. female presents to the emergency department with bilateral otalgia worsened with palpation of the tragus.  Patient reports that she has had symptoms for approximately 1 month.  Patient was previously prescribed neomycin polymyxin hydrocortisone optic solution and has been using medication as prescribed.  Patient reports no improvement in her symptoms.  She denies fever and chills.   Past Medical History:  Diagnosis Date  . Arthritis   . Iron deficiency     There are no active problems to display for this patient.   No past surgical history on file.  Prior to Admission medications   Medication Sig Start Date End Date Taking? Authorizing Provider  ciprofloxacin-dexamethasone (CIPRODEX) OTIC suspension Place 4 drops into both ears 2 (two) times daily for 7 days. 01/03/17 01/10/17  Orvil FeilWoods, Chelsey Kimberley M, PA-C  HYDROcodone-acetaminophen (NORCO/VICODIN) 5-325 MG tablet Take 1 tablet every 4 (four) hours as needed by mouth for moderate pain. 12/20/16 12/20/17  Triplett, Kasandra Knudsenari B, FNP  ibuprofen (ADVIL,MOTRIN) 600 MG tablet Take 1 tablet (600 mg total) by mouth every 6 (six) hours as needed. 10/01/16   Nita SickleVeronese, [place], MD  ibuprofen (ADVIL,MOTRIN) 800 MG tablet Take 1 tablet (800 mg total) every 8 (eight) hours as needed by mouth for moderate pain. 12/16/16   Joni ReiningSmith, Ronald K, PA-C  methocarbamol (ROBAXIN) 500 MG tablet Take 1 tablet (500 mg total) by mouth every 6 (six) hours as needed for muscle spasms. 10/01/16   Nita SickleVeronese, [place], MD  oxyCODONE-acetaminophen (ROXICET) 5-325 MG tablet Take 1 tablet by mouth every 6 (six) hours as needed for moderate pain. 06/10/16   Joni ReiningSmith, Ronald K, PA-C  pseudoephedrine (SUDAFED)  120 MG 12 hr tablet Take 1 tablet (120 mg total) 2 (two) times daily by mouth. 12/16/16 12/16/17  Joni ReiningSmith, Ronald K, PA-C  traMADol (ULTRAM) 50 MG tablet Take 1 tablet (50 mg total) every 6 (six) hours as needed by mouth for moderate pain. 12/16/16   Joni ReiningSmith, Ronald K, PA-C    Allergies Patient has no known allergies.  No family history on file.  Social History Social History   Tobacco Use  . Smoking status: Never Smoker  . Smokeless tobacco: Never Used  Substance Use Topics  . Alcohol use: No  . Drug use: No     Review of Systems  Constitutional: No fever/chills Eyes: No visual changes. No discharge ENT: Patient has bilateral otalgia. Cardiovascular: no chest pain. Respiratory: no cough. No SOB. Gastrointestinal: No abdominal pain.  No nausea, no vomiting.  No diarrhea.  No constipation. Musculoskeletal: Negative for musculoskeletal pain. Skin: Negative for rash, abrasions, lacerations, ecchymosis. Neurological: Negative for headaches, focal weakness or numbness.   ____________________________________________   PHYSICAL EXAM:  VITAL SIGNS: ED Triage Vitals  Enc Vitals Group     BP 01/03/17 2203 124/83     Pulse Rate 01/03/17 2203 76     Resp 01/03/17 2203 18     Temp 01/03/17 2203 98.8 F (37.1 C)     Temp Source 01/03/17 2203 Oral     SpO2 01/03/17 2203 96 %     Weight 01/03/17 2203 240 lb (108.9 kg)     Height 01/03/17 2203 5\' 3"  (1.6 m)     Head Circumference --  Peak Flow --      Pain Score 01/03/17 2202 9     Pain Loc --      Pain Edu? --      Excl. in GC? --      Constitutional: Alert and oriented. Well appearing and in no acute distress. Eyes: Conjunctivae are normal. PERRL. EOMI. Head: Atraumatic. ENT:      Ears: Patient has otalgia reproduced with palpation of the tragus.  TMs are pearly bilaterally.      Nose: No congestion/rhinnorhea.      Mouth/Throat: Mucous membranes are moist.  Neck: Full range of motion.  Cardiovascular: Normal rate,  regular rhythm. Normal S1 and S2.  Good peripheral circulation. Respiratory: Normal respiratory effort without tachypnea or retractions. Lungs CTAB. Good air entry to the bases with no decreased or absent breath sounds. Musculoskeletal: Full range of motion to all extremities. No gross deformities appreciated. Neurologic:  Normal speech and language. No gross focal neurologic deficits are appreciated.  Skin:  Skin is warm, dry and intact. No rash noted. Psychiatric: Mood and affect are normal. Speech and behavior are normal. Patient exhibits appropriate insight and judgement.   ____________________________________________   LABS (all labs ordered are listed, but only abnormal results are displayed)  Labs Reviewed - No data to display ____________________________________________  EKG   ____________________________________________  RADIOLOGY  No results found.  ____________________________________________    PROCEDURES  Procedure(s) performed:    Procedures    Medications  naproxen (NAPROSYN) tablet 500 mg (not administered)     ____________________________________________   INITIAL IMPRESSION / ASSESSMENT AND PLAN / ED COURSE  Pertinent labs & imaging results that were available during my care of the patient were reviewed by me and considered in my medical decision making (see chart for details).  Review of the [place] CSRS was performed in accordance of the [facility] prior to dispensing any controlled drugs.     Assessment and plan Otitis externa Patient presents to the emergency department with reproducible pain to palpation of the tragus.  History and physical exam findings are consistent with otitis externa.  Patient was discharged with Ciprodex and advised to follow-up with primary care as needed.  Vital signs are reassuring prior to discharge.  All patient questions were answered.     ____________________________________________  FINAL CLINICAL IMPRESSION(S)  / ED DIAGNOSES  Final diagnoses:  Diffuse otitis externa of both ears, unspecified chronicity      NEW MEDICATIONS STARTED DURING THIS VISIT:  ED Discharge Orders        Ordered    ciprofloxacin-dexamethasone (CIPRODEX) OTIC suspension  2 times daily     01/03/17 2252          This chart was dictated using voice recognition software/Dragon. Despite best efforts to proofread, errors can occur which can change the meaning. Any change was purely unintentional.    Orvil FeilWoods, Amando Chaput M, PA-C 01/03/17 2300    Merrily Brittleifenbark, Neil, MD 01/04/17 1045

## 2017-01-03 NOTE — ED Triage Notes (Signed)
Pt has been treated this month for ear infection and eye infection, pt now having pain in both her ears, pt tearful in triage

## 2017-01-03 NOTE — ED Notes (Signed)
Pt's mother is answering questions for patient during nursing assessment. Pt's mother states pt has bilateral ear pain for over one month. Mother states pt has been seen at ed for same multiple times, however pain continues. Pt winces when rn attempts to visualize ear canals. No drainage noted. Pt has cotton in both ears that this rn removed for assessment.

## 2017-01-13 ENCOUNTER — Other Ambulatory Visit: Payer: Self-pay

## 2017-01-13 ENCOUNTER — Emergency Department
Admission: EM | Admit: 2017-01-13 | Discharge: 2017-01-13 | Disposition: A | Discharge status: Home / Self Care | Payer: Self-pay | Attending: Emergency Medicine | Admitting: Emergency Medicine

## 2017-01-13 DIAGNOSIS — H9203 Otalgia, bilateral: Secondary | ICD-10-CM | POA: Insufficient documentation

## 2017-01-13 DIAGNOSIS — Z79899 Other long term (current) drug therapy: Secondary | ICD-10-CM | POA: Insufficient documentation

## 2017-01-13 MED ORDER — PREDNISONE 10 MG PO TABS: ORAL_TABLET | ORAL | 0 refills | Status: DC

## 2017-01-13 MED ORDER — DEXAMETHASONE SODIUM PHOSPHATE 10 MG/ML IJ SOLN
10.0000 mg | Freq: Once | INTRAMUSCULAR | Status: AC
Start: 2017-01-13 — End: 2017-01-13
  Administered 2017-01-13: 10 mg via INTRAMUSCULAR
  Filled 2017-01-13: qty 1

## 2017-01-13 MED ORDER — KETOROLAC TROMETHAMINE 30 MG/ML IJ SOLN
30.0000 mg | Freq: Once | INTRAMUSCULAR | Status: AC
  Administered 2017-01-13: 30 mg via INTRAMUSCULAR
  Filled 2017-01-13: qty 1

## 2017-01-13 NOTE — ED Triage Notes (Signed)
Pt to ER via POV c/o bilateral ear pain intermittent X 1 month. States she has been using prescribed ear drops without any relief. Pt alert and oriented X4, active, cooperative, pt in NAD. RR even and unlabored, color WNL.

## 2017-01-13 NOTE — ED Notes (Signed)
Pt has followed up with ENT who she reports state the keep coming to ER until she is able to "have tube run down ears", because she is unable to have that done at this time due to insurance reasons.

## 2017-01-13 NOTE — ED Provider Notes (Signed)
[facility] Emergency Department Provider Note  ____________________________________________  Time seen: Approximately 3:58 PM  I have reviewed the triage vital signs and the nursing notes.   HISTORY  Chief Complaint Otalgia    HPI Gabrielle Erickson is a 21 y.o. female that presents to the emergency department for evaluation of bilateral ear pain for 1 months.  Pain initially started in her right ear.  For the last 3 weeks pain has been in both ears.  She describes the feeling as pain and pressure.  It keeps her up at night.  Loud noises at work make the pain worse.  She has been to the emergency department for this 3 times and given different prescription drops.  She has also seen her primary care doctor who has given her oral antibiotics.  She saw ENT but they are unable to do anything until she gets full insurance. She is not sure what ENT told her the problem was.  She thinks that they recommended tubes in her ears. No recent illness. No fever, chills, visual changes, nasal congestion, nausea, vomiting.   Past Medical History:  Diagnosis Date  . Arthritis   . Iron deficiency     There are no active problems to display for this patient.   History reviewed. No pertinent surgical history.  Prior to Admission medications   Medication Sig Start Date End Date Taking? Authorizing Provider  HYDROcodone-acetaminophen (NORCO/VICODIN) 5-325 MG tablet Take 1 tablet every 4 (four) hours as needed by mouth for moderate pain. 12/20/16 12/20/17  Triplett, Kasandra Knudsenari B, FNP  ibuprofen (ADVIL,MOTRIN) 600 MG tablet Take 1 tablet (600 mg total) by mouth every 6 (six) hours as needed. 10/01/16   Nita SickleVeronese, [place], MD  ibuprofen (ADVIL,MOTRIN) 800 MG tablet Take 1 tablet (800 mg total) every 8 (eight) hours as needed by mouth for moderate pain. 12/16/16   Joni ReiningSmith, Ronald K, PA-C  methocarbamol (ROBAXIN) 500 MG tablet Take 1 tablet (500 mg total) by mouth every 6 (six) hours as needed for  muscle spasms. 10/01/16   Nita SickleVeronese, [place], MD  oxyCODONE-acetaminophen (ROXICET) 5-325 MG tablet Take 1 tablet by mouth every 6 (six) hours as needed for moderate pain. 06/10/16   Joni ReiningSmith, Ronald K, PA-C  predniSONE (DELTASONE) 10 MG tablet Take 6 tablets on day 1, take 5 tablets on day 2, take 4 tablets on day 3, take 3 tablets on day 4, take 2 tablets on day 5, take 1 tablet on day 6 01/13/17   Enid DerryWagner, Dorian Renfro, PA-C  pseudoephedrine (SUDAFED) 120 MG 12 hr tablet Take 1 tablet (120 mg total) 2 (two) times daily by mouth. 12/16/16 12/16/17  Joni ReiningSmith, Ronald K, PA-C  traMADol (ULTRAM) 50 MG tablet Take 1 tablet (50 mg total) every 6 (six) hours as needed by mouth for moderate pain. 12/16/16   Joni ReiningSmith, Ronald K, PA-C    Allergies Patient has no known allergies.  No family history on file.  Social History Social History   Tobacco Use  . Smoking status: Never Smoker  . Smokeless tobacco: Never Used  Substance Use Topics  . Alcohol use: No  . Drug use: No     Review of Systems  Constitutional: No fever/chills Cardiovascular: No chest pain. Respiratory:  No SOB. Gastrointestinal: No abdominal pain.  No nausea, no vomiting.  Musculoskeletal: Negative for musculoskeletal pain. Skin: Negative for rash, abrasions, lacerations, ecchymosis. Neurological: Negative for numbness or tingling   ____________________________________________   PHYSICAL EXAM:  VITAL SIGNS: ED Triage Vitals  Enc Vitals  Group     BP 01/13/17 1500 128/72     Pulse Rate 01/13/17 1500 73     Resp 01/13/17 1500 18     Temp 01/13/17 1500 99 F (37.2 C)     Temp Source 01/13/17 1500 Oral     SpO2 01/13/17 1500 100 %     Weight 01/13/17 1500 240 lb (108.9 kg)     Height 01/13/17 1500 5\' 3"  (1.6 m)     Head Circumference --      Peak Flow --      Pain Score 01/13/17 1459 10     Pain Loc --      Pain Edu? --      Excl. in GC? --      Constitutional: Alert and oriented. Well appearing and in no acute distress. Eyes:  Conjunctivae are normal. PERRL. EOMI. Head: Atraumatic. ENT:      Ears: Tympanic membranes pearly.  Tenderness to palpation of bilateral pinna.  No cerumen or drainage.  No swelling or erythema over mastoid process.      Nose: No congestion/rhinnorhea.      Mouth/Throat: Mucous membranes are moist.  Neck: No stridor.  Cardiovascular: Normal rate, regular rhythm.  Good peripheral circulation. Respiratory: Normal respiratory effort without tachypnea or retractions. Lungs CTAB. Good air entry to the bases with no decreased or absent breath sounds. Musculoskeletal: Full range of motion to all extremities. No gross deformities appreciated. Neurologic:  Normal speech and language. No gross focal neurologic deficits are appreciated.  Skin:  Skin is warm, dry and intact. No rash noted.   ____________________________________________   LABS (all labs ordered are listed, but only abnormal results are displayed)  Labs Reviewed - No data to display ____________________________________________  EKG   ____________________________________________  RADIOLOGY  No results found.  ____________________________________________    PROCEDURES  Procedure(s) performed:    Procedures    Medications  dexamethasone (DECADRON) injection 10 mg (10 mg Intramuscular Given 01/13/17 1608)  ketorolac (TORADOL) 30 MG/ML injection 30 mg (30 mg Intramuscular Given 01/13/17 1607)     ____________________________________________   INITIAL IMPRESSION / ASSESSMENT AND PLAN / ED COURSE  Pertinent labs & imaging results that were available during my care of the patient were reviewed by me and considered in my medical decision making (see chart for details).  Review of the [place] CSRS was performed in accordance of the [facility] prior to dispensing any controlled drugs.  Patient presented to the emergency department for evaluation of bilateral ear pain for 1 month.  Vital signs and exam are reassuring.  Patient  has tried both antibiotic drops and oral antibiotics.  She has had an appointment with ENT but they are unable to help until she has full insurance.  I am unable to visualize an inner outer ear infection.  We will try a course of steroids for eustachian tube dysfunction patient will be discharged home with prescriptions for prednisone. Patient is to follow up with ENT as directed. Patient is given ED precautions to return to the ED for any worsening or new symptoms.     ____________________________________________  FINAL CLINICAL IMPRESSION(S) / ED DIAGNOSES  Final diagnoses:  Otalgia of both ears      NEW MEDICATIONS STARTED DURING THIS VISIT:  ED Discharge Orders        Ordered    predniSONE (DELTASONE) 10 MG tablet     01/13/17 1628          This chart was dictated using voice  recognition software/Dragon. Despite best efforts to proofread, errors can occur which can change the meaning. Any change was purely unintentional.    Enid DerryWagner, Elania Crowl, PA-C 01/13/17 1738    Pershing ProudSchaevitz, Myra Rudeavid Matthew, MD 01/13/17 [phone]

## 2017-02-22 ENCOUNTER — Emergency Department
Admission: EM | Admit: 2017-02-22 | Discharge: 2017-02-22 | Disposition: A | Discharge status: Home / Self Care | Payer: Managed Care, Other (non HMO) | Attending: Emergency Medicine | Admitting: Emergency Medicine

## 2017-02-22 ENCOUNTER — Other Ambulatory Visit: Payer: Self-pay

## 2017-02-22 ENCOUNTER — Encounter: Payer: Self-pay | Admitting: Emergency Medicine

## 2017-02-22 DIAGNOSIS — Y999 Unspecified external cause status: Secondary | ICD-10-CM | POA: Diagnosis not present

## 2017-02-22 DIAGNOSIS — X509XXD Other and unspecified overexertion or strenuous movements or postures, subsequent encounter: Secondary | ICD-10-CM | POA: Insufficient documentation

## 2017-02-22 DIAGNOSIS — S46911D Strain of unspecified muscle, fascia and tendon at shoulder and upper arm level, right arm, subsequent encounter: Secondary | ICD-10-CM | POA: Diagnosis not present

## 2017-02-22 DIAGNOSIS — Y939 Activity, unspecified: Secondary | ICD-10-CM | POA: Insufficient documentation

## 2017-02-22 DIAGNOSIS — G8929 Other chronic pain: Secondary | ICD-10-CM

## 2017-02-22 DIAGNOSIS — Z79899 Other long term (current) drug therapy: Secondary | ICD-10-CM | POA: Diagnosis not present

## 2017-02-22 DIAGNOSIS — Y929 Unspecified place or not applicable: Secondary | ICD-10-CM | POA: Insufficient documentation

## 2017-02-22 DIAGNOSIS — M25511 Pain in right shoulder: Secondary | ICD-10-CM

## 2017-02-22 MED ORDER — CYCLOBENZAPRINE HCL 5 MG PO TABS: 5.0000 mg | ORAL_TABLET | Freq: Three times a day (TID) | ORAL | 0 refills | Status: DC | PRN

## 2017-02-22 MED ORDER — CYCLOBENZAPRINE HCL 10 MG PO TABS
10.0000 mg | ORAL_TABLET | Freq: Once | ORAL | Status: AC
  Administered 2017-02-22: 10 mg via ORAL
  Filled 2017-02-22: qty 1

## 2017-02-22 NOTE — Discharge Instructions (Signed)
Your exam is consistent with strain and inflammation to the shoulder. You have chronic, intermittent symptoms. You should eventually follow-up with ortho for further management. Take the prescription muscle relaxant as needed. Take OTC ibuprofen or naproxen as directed. Perform the exercises provided in this handout.

## 2017-02-22 NOTE — ED Triage Notes (Signed)
C/O right shoulder pain.  Reports an injury to shoulder 3 years ago, but no recent injury.

## 2017-02-22 NOTE — ED Provider Notes (Signed)
[facility] Emergency Department Provider Note ____________________________________________  Time seen: 1630  I have reviewed the triage vital signs and the nursing notes.  HISTORY  Chief Complaint  Shoulder Pain  HPI Gabrielle Erickson is a 22 y.o. female presents to the ED for evaluation of right shoulder pain.  Patient describes the pain to the right without recent injury or accident.  She gives a history of a remote injury some 3 years prior, but denies any surgery, only intermittent shoulder pain. She notes increased intermittent pain with ROM over the last 2 weeks. She has not taken any medicine in the interim for symptom relief. She denies any particular activities to precede the onset of pain. She also notes she has not seen ortho, as has been referred in the past. She was last seen here in May 2018 for the same complaint. She noted relief with the previously prescribed muscle relaxants.   Past Medical History:  Diagnosis Date  . Arthritis   . Iron deficiency     There are no active problems to display for this patient.  History reviewed. No pertinent surgical history.  Prior to Admission medications   Medication Sig Start Date End Date Taking? Authorizing Provider  cyclobenzaprine (FLEXERIL) 5 MG tablet Take 1 tablet (5 mg total) by mouth 3 (three) times daily as needed for muscle spasms. 02/22/17   Jhonnie Aliano, Charlesetta Ivory, PA-C  HYDROcodone-acetaminophen (NORCO/VICODIN) 5-325 MG tablet Take 1 tablet every 4 (four) hours as needed by mouth for moderate pain. 12/20/16 12/20/17  Triplett, Kasandra Knudsen, FNP  ibuprofen (ADVIL,MOTRIN) 600 MG tablet Take 1 tablet (600 mg total) by mouth every 6 (six) hours as needed. 10/01/16   Nita Sickle, MD  ibuprofen (ADVIL,MOTRIN) 800 MG tablet Take 1 tablet (800 mg total) every 8 (eight) hours as needed by mouth for moderate pain. 12/16/16   Joni Reining, PA-C  methocarbamol (ROBAXIN) 500 MG tablet Take 1 tablet (500 mg  total) by mouth every 6 (six) hours as needed for muscle spasms. 10/01/16   Nita Sickle, MD  oxyCODONE-acetaminophen (ROXICET) 5-325 MG tablet Take 1 tablet by mouth every 6 (six) hours as needed for moderate pain. 06/10/16   Joni Reining, PA-C  predniSONE (DELTASONE) 10 MG tablet Take 6 tablets on day 1, take 5 tablets on day 2, take 4 tablets on day 3, take 3 tablets on day 4, take 2 tablets on day 5, take 1 tablet on day 6 01/13/17   Enid Derry, PA-C  pseudoephedrine (SUDAFED) 120 MG 12 hr tablet Take 1 tablet (120 mg total) 2 (two) times daily by mouth. 12/16/16 12/16/17  Joni Reining, PA-C  traMADol (ULTRAM) 50 MG tablet Take 1 tablet (50 mg total) every 6 (six) hours as needed by mouth for moderate pain. 12/16/16   Joni Reining, PA-C    Allergies Patient has no known allergies.  No family history on file.  Social History Social History   Tobacco Use  . Smoking status: Never Smoker  . Smokeless tobacco: Never Used  Substance Use Topics  . Alcohol use: No  . Drug use: No    Review of Systems  Constitutional: Negative for fever. Cardiovascular: Negative for chest pain. Respiratory: Negative for shortness of breath. Gastrointestinal: Negative for abdominal pain, vomiting and diarrhea. Musculoskeletal: Negative for back pain. Right shoulder pain. Skin: Negative for rash. Neurological: Negative for headaches, focal weakness or numbness. ____________________________________________  PHYSICAL EXAM:  VITAL SIGNS: ED Triage Vitals  Enc Vitals  Group     BP 02/22/17 1631 107/63     Pulse Rate 02/22/17 1631 67     Resp 02/22/17 1631 20     Temp 02/22/17 1631 98.2 F (36.8 C)     Temp Source 02/22/17 1631 Oral     SpO2 02/22/17 1631 97 %     Weight 02/22/17 1631 243 lb (110.2 kg)     Height 02/22/17 1631 5\' 3"  (1.6 m)     Head Circumference --      Peak Flow --      Pain Score 02/22/17 1604 7     Pain Loc --      Pain Edu? --      Excl. in GC? --      Constitutional: Alert and oriented. Well appearing and in no distress. Head: Normocephalic and atraumatic. Neck: Supple. No thyromegaly. Cardiovascular: Normal rate, regular rhythm. Normal distal pulses. Respiratory: Normal respiratory effort. No wheezes/rales/rhonchi. Musculoskeletal: Right shoulder without any obvious deformity, dislocation, or sulcus sign.  Normal active range of motion to the left shoulder in all planes.  She has normal rotator cuff testing with resistance intact.  Normal composite fist and grip strength. Nontender with normal range of motion in all extremities.  Neurologic: CN II-XII grossly intact. Normal gait without ataxia. Normal speech and language. No gross focal neurologic deficits are appreciated. Skin:  Skin is warm, dry and intact. No rash noted. ____________________________________________  PROCEDURES  Procedures Flexeril 10 mg PO ____________________________________________  INITIAL IMPRESSION / ASSESSMENT AND PLAN / ED COURSE  Chronic, intermittent right shoulder pain without recent injury.  Patient has been seen multiple times in the past for similar complaints of the right shoulder.  Her symptoms likely represent an intermittent tendinitis to the right shoulder.  Her exam is overall benign without any acute neuromuscular deficit, or acute rotator cuff tear.  She is discharged at this time with a prescription for Flexeril dose in addition to over-the-counter anti-inflammatories.  She is also referred to orthopedics for further evaluation and management.  Patient's request for a sling for the arm was denied at this time, noting that her symptoms being of a chronic nature did not warrant an arm sling.  She is advised instead, to utilize shoulder exercises that have been provided.  She is also given a work note for today noting she was evaluated in the ED.  She will follow-up with Dr[facility] when her insurance becomes active.  Return precautions have been  reviewed. ____________________________________________  FINAL CLINICAL IMPRESSION(S) / ED DIAGNOSES  Final diagnoses:  Chronic right shoulder pain  Strain of right shoulder, subsequent encounter      Gabrielle Erickson, Josimar Corning V Bacon, PA-C 02/22/17 1852    Arnaldo NatalMalinda, Paul F, MD 02/22/17 2337

## 2017-02-24 ENCOUNTER — Encounter: Payer: Self-pay | Admitting: Emergency Medicine

## 2017-02-24 ENCOUNTER — Emergency Department
Admission: EM | Admit: 2017-02-24 | Discharge: 2017-02-24 | Disposition: A | Discharge status: Home / Self Care | Payer: Managed Care, Other (non HMO) | Attending: Student in an Organized Health Care Education/Training Program | Admitting: Student in an Organized Health Care Education/Training Program

## 2017-02-24 ENCOUNTER — Other Ambulatory Visit: Payer: Self-pay

## 2017-02-24 DIAGNOSIS — Y93G3 Activity, cooking and baking: Secondary | ICD-10-CM | POA: Diagnosis not present

## 2017-02-24 DIAGNOSIS — Z79899 Other long term (current) drug therapy: Secondary | ICD-10-CM | POA: Diagnosis not present

## 2017-02-24 DIAGNOSIS — T2122XA Burn of second degree of abdominal wall, initial encounter: Secondary | ICD-10-CM | POA: Diagnosis not present

## 2017-02-24 DIAGNOSIS — T2101XA Burn of unspecified degree of chest wall, initial encounter: Secondary | ICD-10-CM | POA: Diagnosis present

## 2017-02-24 DIAGNOSIS — Y999 Unspecified external cause status: Secondary | ICD-10-CM | POA: Diagnosis not present

## 2017-02-24 DIAGNOSIS — Y929 Unspecified place or not applicable: Secondary | ICD-10-CM | POA: Diagnosis not present

## 2017-02-24 DIAGNOSIS — X153XXA Contact with hot saucepan or skillet, initial encounter: Secondary | ICD-10-CM | POA: Diagnosis not present

## 2017-02-24 MED ORDER — IBUPROFEN 800 MG PO TABS: 800.0000 mg | ORAL_TABLET | Freq: Three times a day (TID) | ORAL | 0 refills | Status: DC | PRN

## 2017-02-24 MED ORDER — SILVER SULFADIAZINE 1 % EX CREA
TOPICAL_CREAM | CUTANEOUS | Status: AC
  Administered 2017-02-24: 1 via TOPICAL
  Filled 2017-02-24: qty 85

## 2017-02-24 MED ORDER — TRAMADOL HCL 50 MG PO TABS: 50.0000 mg | ORAL_TABLET | Freq: Four times a day (QID) | ORAL | 0 refills | Status: DC | PRN

## 2017-02-24 MED ORDER — SILVER SULFADIAZINE 1 % EX CREA
TOPICAL_CREAM | Freq: Once | CUTANEOUS | Status: AC
  Administered 2017-02-24: 1 via TOPICAL
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
## 2017-02-24 NOTE — Discharge Instructions (Signed)
Follow-up with your regular doctor or return to the ER in 2 days for recheck of the wound, continue to apply the Silvadene cream to the area, you need to change her dressing daily, take ibuprofen for pain, tramadol for pain not controlled by ibuprofen, if you are worsening please return the emergency department
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
## 2017-07-21 ENCOUNTER — Encounter: Payer: Self-pay | Admitting: Emergency Medicine

## 2017-07-21 ENCOUNTER — Emergency Department: Payer: Managed Care, Other (non HMO)

## 2017-07-21 ENCOUNTER — Emergency Department
Admission: EM | Admit: 2017-07-21 | Discharge: 2017-07-21 | Disposition: A | Discharge status: Home / Self Care | Payer: Managed Care, Other (non HMO) | Attending: Emergency Medicine | Admitting: Emergency Medicine

## 2017-07-21 DIAGNOSIS — R0789 Other chest pain: Secondary | ICD-10-CM | POA: Insufficient documentation

## 2017-07-21 DIAGNOSIS — F410 Panic disorder [episodic paroxysmal anxiety] without agoraphobia: Secondary | ICD-10-CM | POA: Diagnosis not present

## 2017-07-21 DIAGNOSIS — R079 Chest pain, unspecified: Secondary | ICD-10-CM | POA: Diagnosis present

## 2017-07-21 LAB — BASIC METABOLIC PANEL
ANION GAP: 13 (ref 5–15)
BUN: 10 mg/dL (ref 6–20)
CALCIUM: 9.4 mg/dL (ref 8.9–10.3)
CO2: 20 mmol/L — AB (ref 22–32)
Chloride: 104 mmol/L (ref 101–111)
Creatinine, Ser: 0.82 mg/dL (ref 0.44–1.00)
Glucose, Bld: 109 mg/dL — ABNORMAL HIGH (ref 65–99)
POTASSIUM: 3.6 mmol/L (ref 3.5–5.1)
Sodium: 137 mmol/L (ref 135–145)

## 2017-07-21 LAB — CBC
HEMATOCRIT: 40.1 % (ref 35.0–47.0)
HEMOGLOBIN: 13.7 g/dL (ref 12.0–16.0)
MCH: 29.6 pg (ref 26.0–34.0)
MCHC: 34.3 g/dL (ref 32.0–36.0)
MCV: 86.5 fL (ref 80.0–100.0)
Platelets: 327 10*3/uL (ref 150–440)
RBC: 4.63 MIL/uL (ref 3.80–5.20)
RDW: 13.8 % (ref 11.5–14.5)
WBC: 7.9 10*3/uL (ref 3.6–11.0)

## 2017-07-21 LAB — TROPONIN I

## 2017-07-21 MED ORDER — IBUPROFEN 600 MG PO TABS
600.0000 mg | ORAL_TABLET | Freq: Once | ORAL | Status: AC
  Administered 2017-07-21: 600 mg via ORAL
  Filled 2017-07-21: qty 1

## 2017-07-21 MED ORDER — DIAZEPAM 2 MG PO TABS
2.0000 mg | ORAL_TABLET | Freq: Once | ORAL | Status: AC
  Administered 2017-07-21: 2 mg via ORAL
  Filled 2017-07-21: qty 1

## 2017-07-21 NOTE — Discharge Instructions (Addendum)
Return to the ER for new, worsening, persistent severe chest discomfort or pain, difficulty breathing, weakness or lightheadedness, worsening severe anxiety, or any other new or worsening symptoms that concern you.  Follow-up with your primary care doctor.

## 2017-07-21 NOTE — ED Notes (Signed)
Spoke with pt about wait times and what to expect next. Advised pt that I am available for further questions if needed.  

## 2017-07-21 NOTE — ED Triage Notes (Signed)
Pt arrived with employer after a sudden onset of left sided chest pain/tightness. Pt very anxious in triage and hyperventilating. Pt helped to slow breathing. Pt states she has been having panic attacks since Monday. Pt denies SI/Hi

## 2017-07-21 NOTE — ED Provider Notes (Signed)
[facility] Emergency Department Provider Note ____________________________________________   First MD Initiated Contact with Patient 07/21/17 1619     (approximate)  I have reviewed the triage vital signs and the nursing notes.   HISTORY  Chief Complaint Chest Pain and Panic Attack    HPI Gabrielle Erickson is a 22 y.o. female who presents with chest discomfort, described as pressure-like, left-sided, and associated with shortness of breath, acute onset about 30 minutes prior to arrival.  Patient states that it feels like a panic attack, and she has been having similar episodes over the last 3 days.  The patient states that her dog was injured several days ago and when she sees the dog's blood in her car she becomes upset which then triggers anxiety.  The patient denies any other recent illness.  No prior history of panic attacks before the last few days.   Past Medical History:  Diagnosis Date  . Arthritis   . Iron deficiency     There are no active problems to display for this patient.   History reviewed. No pertinent surgical history.  Prior to Admission medications   Medication Sig Start Date End Date Taking? Authorizing Provider  cyclobenzaprine (FLEXERIL) 5 MG tablet Take 1 tablet (5 mg total) by mouth 3 (three) times daily as needed for muscle spasms. 02/22/17   Menshew, Charlesetta Ivory, PA-C  HYDROcodone-acetaminophen (NORCO/VICODIN) 5-325 MG tablet Take 1 tablet every 4 (four) hours as needed by mouth for moderate pain. 12/20/16 12/20/17  Triplett, Kasandra Knudsen, FNP  ibuprofen (ADVIL,MOTRIN) 800 MG tablet Take 1 tablet (800 mg total) by mouth every 8 (eight) hours as needed. 02/24/17   Fisher, Roselyn Bering, PA-C  methocarbamol (ROBAXIN) 500 MG tablet Take 1 tablet (500 mg total) by mouth every 6 (six) hours as needed for muscle spasms. 10/01/16   Nita Sickle, MD  oxyCODONE-acetaminophen (ROXICET) 5-325 MG tablet Take 1 tablet by mouth every 6 (six) hours  as needed for moderate pain. 06/10/16   Joni Reining, PA-C  predniSONE (DELTASONE) 10 MG tablet Take 6 tablets on day 1, take 5 tablets on day 2, take 4 tablets on day 3, take 3 tablets on day 4, take 2 tablets on day 5, take 1 tablet on day 6 01/13/17   Enid Derry, PA-C  pseudoephedrine (SUDAFED) 120 MG 12 hr tablet Take 1 tablet (120 mg total) 2 (two) times daily by mouth. 12/16/16 12/16/17  Joni Reining, PA-C  traMADol (ULTRAM) 50 MG tablet Take 1 tablet (50 mg total) by mouth every 6 (six) hours as needed. 02/24/17   Faythe Ghee, PA-C    Allergies Patient has no known allergies.  No family history on file.  Social History Social History   Tobacco Use  . Smoking status: Never Smoker  . Smokeless tobacco: Never Used  Substance Use Topics  . Alcohol use: No  . Drug use: No    Review of Systems  Constitutional: No fever. Eyes: No redness. ENT: No sore throat. Cardiovascular: Positive for chest pain. Respiratory: Positive for shortness of breath. Gastrointestinal: No vomiting.  Genitourinary: Negative for flank pain.  Musculoskeletal: Negative for back pain. Skin: Negative for rash. Neurological: Negative for headache.   ____________________________________________   PHYSICAL EXAM:  VITAL SIGNS: ED Triage Vitals  Enc Vitals Group     BP 07/21/17 1435 (!) 100/45     Pulse Rate 07/21/17 1433 90     Resp 07/21/17 1433 (!) 24  Temp 07/21/17 1433 97.9 F (36.6 C)     Temp Source 07/21/17 1433 Oral     SpO2 07/21/17 1435 100 %     Weight 07/21/17 1433 240 lb (108.9 kg)     Height 07/21/17 1433 5\' 2"  (1.575 m)     Head Circumference --      Peak Flow --      Pain Score 07/21/17 1433 8     Pain Loc --      Pain Edu? --      Excl. in GC? --     Constitutional: Alert and oriented.  Very anxious appearing but in no acute distress.   Eyes: Conjunctivae are normal.  Head: Atraumatic. Nose: No congestion/rhinnorhea. Mouth/Throat: Mucous membranes are  somewhat dry.   Neck: Normal range of motion.  Cardiovascular: Normal rate, regular rhythm. Grossly normal heart sounds.  Good peripheral circulation. Respiratory: Normal respiratory effort.  No retractions. Lungs CTAB. Gastrointestinal: Soft and nontender. No distention.  Genitourinary: No flank tenderness. Musculoskeletal: No lower extremity edema.  Extremities warm and well perfused.  Mild left chest wall tenderness. Neurologic:  Normal speech and language. No gross focal neurologic deficits are appreciated.  Skin:  Skin is warm and dry. No rash noted. Psychiatric: Mood and affect are normal. Speech and behavior are normal.  ____________________________________________   LABS (all labs ordered are listed, but only abnormal results are displayed)  Labs Reviewed  BASIC METABOLIC PANEL - Abnormal; Notable for the following components:      Result Value   CO2 20 (*)    Glucose, Bld 109 (*)    All other components within normal limits  CBC  TROPONIN I  POC URINE PREG, ED   ____________________________________________  EKG  ED ECG REPORT I, Dionne BucySebastian Morgon Pamer, the attending physician, personally viewed and interpreted this ECG.  Date: 07/21/2017 EKG Time: 1428 Rate: 94 Rhythm: normal sinus rhythm QRS Axis: normal Intervals: normal ST/T Wave abnormalities: normal Narrative Interpretation: no evidence of acute ischemia  ____________________________________________  RADIOLOGY    ____________________________________________   PROCEDURES  Procedure(s) performed: No  Procedures  Critical Care performed: No ____________________________________________   INITIAL IMPRESSION / ASSESSMENT AND PLAN / ED COURSE  Pertinent labs & imaging results that were available during my care of the patient were reviewed by me and considered in my medical decision making (see chart for details).  22 year old female with PMH of iron deficiency anemia presents with chest pain and  shortness of breath and the onset of acute anxiety, in which she is describing as a panic attack.  No prior cardiac history.  I reviewed the past medical records in Epic, and the patient has had several prior ED visits over the last few years but none for a similar presentation.  On exam, vital signs are normal, the patient is anxious appearing, and the remainder of the exam is as described above.  She was described as hyperventilating in triage, however this appears to have resolved.  Overall presentation is consistent with acute anxiety/panic.  Given her normal EKG, vital signs, and exam, there is no evidence of primary cardiac etiology, arrhythmia, PE, or other concerning etiology of the patient's symptoms.  Initial labs and troponin are within normal limits.  No indication for repeat troponin given patient's extremely low risk for ACS.  We will treat symptomatically with Valium and NSAIDs and reassess.  ----------------------------------------- 6:26 PM on 07/21/2017 -----------------------------------------  Patient reports her symptoms are fully resolved.  Vital signs are stable.  She appears  much more comfortable and calm.  She would like to go home.  I counseled the patient on the likely cause of her symptoms and the importance of primary care follow-up.  Return precautions given, and she expresses understanding.  ____________________________________________   FINAL CLINICAL IMPRESSION(S) / ED DIAGNOSES  Final diagnoses:  Anxiety attack  Atypical chest pain      NEW MEDICATIONS STARTED DURING THIS VISIT:  New Prescriptions   No medications on file     Note:  This document was prepared using Dragon voice recognition software and may include unintentional dictation errors.   Dionne Bucy, MD 07/21/17 1827

## 2017-10-19 ENCOUNTER — Encounter: Payer: Self-pay | Admitting: Emergency Medicine

## 2017-10-19 ENCOUNTER — Emergency Department
Admission: EM | Admit: 2017-10-19 | Discharge: 2017-10-19 | Disposition: A | Discharge status: Home / Self Care | Payer: Managed Care, Other (non HMO) | Attending: Emergency Medicine | Admitting: Emergency Medicine

## 2017-10-19 DIAGNOSIS — H9202 Otalgia, left ear: Secondary | ICD-10-CM | POA: Insufficient documentation

## 2017-10-19 MED ORDER — NAPROXEN 500 MG PO TABS: 500.0000 mg | ORAL_TABLET | Freq: Once | ORAL | Status: DC

## 2017-10-19 MED ORDER — AMOXICILLIN-POT CLAVULANATE 875-125 MG PO TABS: 1.0000 | ORAL_TABLET | Freq: Two times a day (BID) | ORAL | 0 refills | Status: AC

## 2017-10-19 MED ORDER — NAPROXEN 500 MG PO TABS: 500.0000 mg | ORAL_TABLET | Freq: Two times a day (BID) | ORAL | 0 refills | Status: DC

## 2017-10-19 NOTE — Discharge Instructions (Signed)
Call make an appointment at [facility] for follow-up.  The ENT doctor on call today is Dr. Jenne Campus however you can make an appointment with anyone with an available appointment.  Begin taking Augmentin 875 twice daily for 10 days and naproxen 500 mg for the next 4 weeks.  Be sure to take the naproxen with food.  Also avoid chewing gum or foods that require a lot of chewing during this time which should help control some of your pain.

## 2017-10-19 NOTE — ED Triage Notes (Signed)
Pt to ED with c/o of left ear pain that has been ongoing for approx 5 months but has increased since yesterday.

## 2017-10-19 NOTE — ED Provider Notes (Signed)
[facility] Emergency Department Provider Note  ____________________________________________   First MD Initiated Contact with Patient 10/19/17 1316     (approximate)  I have reviewed the triage vital signs and the nursing notes.   HISTORY  Chief Complaint Otalgia   HPI Gabrielle Erickson is a 22 y.o. female presents to the emergency department with complaint of left ear pain for approximately 5 months.  She states that she is been seen twice before for this and placed on eardrops which is actually made her ear bleed.  She denies any fever or chills.  She states that in the past she has been told to follow-up with an ENT and reports that she has not done so.  She denies any injury to her ear.  There is been no drainage from her ear.  She also reports that there is pain with chewing food and opening closing her mouth.  She rates her pain as a 9/10.   Past Medical History:  Diagnosis Date  . Arthritis   . Iron deficiency     There are no active problems to display for this patient.   History reviewed. No pertinent surgical history.  Prior to Admission medications   Medication Sig Start Date End Date Taking? Authorizing Provider  amoxicillin-clavulanate (AUGMENTIN) 875-125 MG tablet Take 1 tablet by mouth 2 (two) times daily for 10 days. 10/19/17 10/29/17  Tommi Rumps, PA-C  naproxen (NAPROSYN) 500 MG tablet Take 1 tablet (500 mg total) by mouth 2 (two) times daily with a meal. 10/19/17 10/19/18  Tommi Rumps, PA-C    Allergies Patient has no known allergies.  History reviewed. No pertinent family history.  Social History Social History   Tobacco Use  . Smoking status: Never Smoker  . Smokeless tobacco: Never Used  Substance Use Topics  . Alcohol use: No  . Drug use: No    Review of Systems Constitutional: No fever/chills Eyes: No visual changes. ENT: Positive chronic left ear pain. Cardiovascular: Denies chest pain. Respiratory:  Denies shortness of breath. Gastrointestinal:  No nausea, no vomiting.  Musculoskeletal: Negative for back pain. Skin: Negative for rash. Neurological: Negative for headaches, focal weakness or numbness. ____________________________________________   PHYSICAL EXAM:  VITAL SIGNS: ED Triage Vitals  Enc Vitals Group     BP 10/19/17 1226 123/76     Pulse Rate 10/19/17 1226 97     Resp 10/19/17 1226 18     Temp 10/19/17 1226 98.7 F (37.1 C)     Temp Source 10/19/17 1226 Oral     SpO2 10/19/17 1226 98 %     Weight 10/19/17 1226 266 lb (120.7 kg)     Height 10/19/17 1226 5\' 2"  (1.575 m)     Head Circumference --      Peak Flow --      Pain Score 10/19/17 1229 9     Pain Loc --      Pain Edu? --      Excl. in GC? --     Constitutional: Alert and oriented. Well appearing and in no acute distress. Eyes: Conjunctivae are normal.  Head: Atraumatic. Nose: No congestion/rhinnorhea.  TMs are dull bilaterally with the left TM with poor light reflex.  Left EAC is moderately tender on exam but no exudate is noted.  There is tenderness on palpation of the TMJ area.  No gross deformity is noted and no crepitus is noted with patient movement. Mouth/Throat: Mucous membranes are moist.  Oropharynx non-erythematous.  Neck: No stridor.   Hematological/Lymphatic/Immunilogical: No cervical lymphadenopathy. Cardiovascular: Normal rate, regular rhythm. Grossly normal heart sounds.  Good peripheral circulation. Respiratory: Normal respiratory effort.  No retractions. Lungs CTAB. Gastrointestinal: Soft and nontender. No distention. No abdominal bruits. No CVA tenderness. Musculoskeletal: Moves upper and lower extremities with any difficulty.  Normal gait was noted. Neurologic:  Normal speech and language. No gross focal neurologic deficits are appreciated. No gait instability. Skin:  Skin is warm, dry and intact. No rash noted. Psychiatric: Mood and affect are normal. Speech and behavior are  normal.  ____________________________________________   LABS (all labs ordered are listed, but only abnormal results are displayed)  Labs Reviewed - No data to display  PROCEDURES  Procedure(s) performed: None  Procedures  Critical Care performed: No  ____________________________________________   INITIAL IMPRESSION / ASSESSMENT AND PLAN / ED COURSE  As part of my medical decision making, I reviewed the following data within the electronic MEDICAL RECORD NUMBER Notes from prior ED visits and [place] Controlled Substance Database  Patient presents to the ED with complaint of chronic left ear pain. In reviewing her records has been placed on prednisone and otic suspensions without any relief of her pain.  She states that chewing food sometimes increases her pain.  She denies any injury to the left side of her face and there is been no history of fever or chills.  On exam no physical findings to explain her chronic ear pain but the possibility of TMJ is suspicious with her history of increased pain with chewing.  Patient was placed on naproxen 500 mg twice daily with food for 30 days and a prescription for Augmentin 875 twice daily for 10 days.  Patient is to follow-up with [facility] for continued ear pain.  Patient was given naproxen prior to discharge for pain.  ____________________________________________   FINAL CLINICAL IMPRESSION(S) / ED DIAGNOSES  Final diagnoses:  Otalgia, left ear     ED Discharge Orders         Ordered    naproxen (NAPROSYN) 500 MG tablet  2 times daily with meals     10/19/17 1401    amoxicillin-clavulanate (AUGMENTIN) 875-125 MG tablet  2 times daily     10/19/17 1401           Note:  This document was prepared using Dragon voice recognition software and may include unintentional dictation errors.    Tommi Rumps, PA-C 10/19/17 1738    Schaevitz, Myra Rude, MD 10/23/17 1316

## 2017-10-19 NOTE — ED Notes (Signed)
Says history of having bilat ear pain for months.  Went away and now has pain in left side face and ear for about 1.5 week.  Says tender to touch in left side face.

## 2017-11-04 ENCOUNTER — Emergency Department
Admission: EM | Admit: 2017-11-04 | Discharge: 2017-11-04 | Disposition: A | Discharge status: Home / Self Care | Payer: Medicaid Other | Attending: Emergency Medicine | Admitting: Emergency Medicine

## 2017-11-04 ENCOUNTER — Encounter: Payer: Self-pay | Admitting: Emergency Medicine

## 2017-11-04 ENCOUNTER — Other Ambulatory Visit: Payer: Self-pay

## 2017-11-04 DIAGNOSIS — R509 Fever, unspecified: Secondary | ICD-10-CM | POA: Insufficient documentation

## 2017-11-04 DIAGNOSIS — R1110 Vomiting, unspecified: Secondary | ICD-10-CM | POA: Insufficient documentation

## 2017-11-04 DIAGNOSIS — Z5321 Procedure and treatment not carried out due to patient leaving prior to being seen by health care provider: Secondary | ICD-10-CM | POA: Insufficient documentation

## 2017-11-04 LAB — COMPREHENSIVE METABOLIC PANEL
ALBUMIN: 4.2 g/dL (ref 3.5–5.0)
ALT: 26 U/L (ref 0–44)
AST: 25 U/L (ref 15–41)
Alkaline Phosphatase: 102 U/L (ref 38–126)
Anion gap: 7 (ref 5–15)
BILIRUBIN TOTAL: 0.3 mg/dL (ref 0.3–1.2)
BUN: 10 mg/dL (ref 6–20)
CALCIUM: 9.5 mg/dL (ref 8.9–10.3)
CO2: 28 mmol/L (ref 22–32)
CREATININE: 0.95 mg/dL (ref 0.44–1.00)
Chloride: 107 mmol/L (ref 98–111)
GFR calc Af Amer: >60 mL/min (ref >60)
GFR calc non Af Amer: >60 mL/min (ref >60)
GLUCOSE: 112 mg/dL — AB (ref 70–99)
POTASSIUM: 4.4 mmol/L (ref 3.5–5.1)
Sodium: 142 mmol/L (ref 135–145)
TOTAL PROTEIN: 7.8 g/dL (ref 6.5–8.1)

## 2017-11-04 LAB — CBC
HCT: 39.2 % (ref 35.0–47.0)
HEMOGLOBIN: 13.3 g/dL (ref 12.0–16.0)
MCH: 29.4 pg (ref 26.0–34.0)
MCHC: 34 g/dL (ref 32.0–36.0)
MCV: 86.4 fL (ref 80.0–100.0)
PLATELETS: 314 10*3/uL (ref 150–440)
RBC: 4.54 MIL/uL (ref 3.80–5.20)
RDW: 13.6 % (ref 11.5–14.5)
WBC: 6.4 10*3/uL (ref 3.6–11.0)

## 2017-11-04 LAB — URINALYSIS, COMPLETE (UACMP) WITH MICROSCOPIC
BILIRUBIN URINE: NEGATIVE
Bacteria, UA: NONE SEEN
Glucose, UA: NEGATIVE mg/dL
HGB URINE DIPSTICK: NEGATIVE
Ketones, ur: NEGATIVE mg/dL
NITRITE: NEGATIVE
PH: 7 (ref 5.0–8.0)
Protein, ur: NEGATIVE mg/dL
SPECIFIC GRAVITY, URINE: 1.017 (ref 1.005–1.030)

## 2017-11-04 LAB — LIPASE, BLOOD: Lipase: 21 U/L (ref 11–51)

## 2017-11-04 LAB — POC URINE PREG, ED: PREG TEST UR: NEGATIVE

## 2017-11-04 NOTE — ED Notes (Signed)
Patient called to be roomed x2, no response noted at this time from patient.  Staff checked restrooms, lobby, and outdoor facility.   

## 2017-11-04 NOTE — ED Notes (Signed)
Patient called to be roomed x1, no response noted at this time from patient.  Staff checked restrooms, lobby, and outdoor facility.   

## 2017-11-04 NOTE — ED Notes (Signed)
Patient called to be roomed x3, no response noted at this time from patient.  Staff checked restrooms, lobby, and outdoor facility.   

## 2017-11-04 NOTE — ED Triage Notes (Addendum)
Pt comes into the ED via POV c/o emesis and fever that started a couple days ago.  Patient states her brother had the same symptoms.  Patient in NAD at this time with even and unlabored respirations.  Patient states she has had 5 episodes of emesis today.  Patient still has pink and moist mucous membranes.  Denies any abdominal pain, diarrhea, or dizziness.

## 2017-11-15 ENCOUNTER — Emergency Department
Admission: EM | Admit: 2017-11-15 | Discharge: 2017-11-15 | Disposition: A | Discharge status: Home / Self Care | Payer: Medicaid Other | Attending: Emergency Medicine | Admitting: Emergency Medicine

## 2017-11-15 ENCOUNTER — Other Ambulatory Visit: Payer: Self-pay

## 2017-11-15 DIAGNOSIS — R600 Localized edema: Secondary | ICD-10-CM | POA: Insufficient documentation

## 2017-11-15 DIAGNOSIS — Z5321 Procedure and treatment not carried out due to patient leaving prior to being seen by health care provider: Secondary | ICD-10-CM | POA: Insufficient documentation

## 2017-11-15 NOTE — ED Triage Notes (Signed)
Pt reports that last night she ate different types of foods - she awoke this am with "swollen face" - no obvious swelling noted to face - c/o top lip feeling numb

## 2017-11-15 NOTE — ED Notes (Signed)
No answer when called several times from lobby 

## 2017-11-15 NOTE — ED Notes (Addendum)
No answer when called several times from lobby 

## 2017-11-15 NOTE — ED Provider Notes (Cosign Needed)
Patient was triaged, however left prior to be seen by myself.  Patient did not inform staff.  Multiple attempts to locate patient was unsuccessful.  No exam or treatment provided.   Racheal Patches, PA-C 11/15/17 2048

## 2017-11-25 ENCOUNTER — Other Ambulatory Visit: Payer: Self-pay | Admitting: Emergency Medicine

## 2018-01-02 ENCOUNTER — Emergency Department
Admission: EM | Admit: 2018-01-02 | Discharge: 2018-01-02 | Disposition: A | Discharge status: Home / Self Care | Payer: Self-pay | Attending: Emergency Medicine | Admitting: Emergency Medicine

## 2018-01-02 ENCOUNTER — Encounter: Payer: Self-pay | Admitting: Emergency Medicine

## 2018-01-02 ENCOUNTER — Other Ambulatory Visit: Payer: Self-pay

## 2018-01-02 ENCOUNTER — Emergency Department: Payer: Self-pay

## 2018-01-02 DIAGNOSIS — M25541 Pain in joints of right hand: Secondary | ICD-10-CM | POA: Insufficient documentation

## 2018-01-02 DIAGNOSIS — M25542 Pain in joints of left hand: Secondary | ICD-10-CM | POA: Insufficient documentation

## 2018-01-02 MED ORDER — MELOXICAM 15 MG PO TABS: 15.0000 mg | ORAL_TABLET | Freq: Every day | ORAL | 2 refills | Status: DC

## 2018-01-02 NOTE — ED Provider Notes (Signed)
[facility] Emergency Department Provider Note  ____________________________________________   First MD Initiated Contact with Patient 01/02/18 1333     (approximate)  I have reviewed the triage vital signs and the nursing notes.   HISTORY  Chief Complaint No chief complaint on file.    HPI Gabrielle Erickson is a 22 y.o. female presents emergency department complaining of bilateral index fingers hurting.  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  She states that her fingers became swollen and had an indention in them and they hurt really bad.  She saw the plant nurse and she put her finger and a finger splint.  She states it hurts to bend the finger.  She denies any other injuries.  She is unsure whether to file Worker's Comp. at this time.  States she just rather follow-up on her insurance.    Past Medical History:  Diagnosis Date  . Arthritis   . Iron deficiency     There are no active problems to display for this patient.   History reviewed. No pertinent surgical history.  Prior to Admission medications   Medication Sig Start Date End Date Taking? Authorizing Provider  meloxicam (MOBIC) 15 MG tablet Take 1 tablet (15 mg total) by mouth daily. 01/02/18 01/02/19  Faythe GheeFisher, Chanae Gemma W, PA-C    Allergies Patient has no known allergies.  No family history on file.  Social History Social History   Tobacco Use  . Smoking status: Never Smoker  . Smokeless tobacco: Never Used  Substance Use Topics  . Alcohol use: No  . Drug use: No    Review of Systems  Constitutional: No fever/chills Eyes: No visual changes. ENT: No sore throat. Respiratory: Denies cough Genitourinary: Negative for dysuria. Musculoskeletal: Negative for back pain.  Bilateral finger pain. Skin: Negative for rash.    ____________________________________________   PHYSICAL EXAM:  VITAL SIGNS: ED Triage Vitals  Enc Vitals Group     BP 01/02/18 1310  110/64     Pulse Rate 01/02/18 1310 70     Resp 01/02/18 1310 16     Temp 01/02/18 1310 98.1 F (36.7 C)     Temp Source 01/02/18 1310 Oral     SpO2 01/02/18 1310 100 %     Weight 01/02/18 1316 220 lb (99.8 kg)     Height 01/02/18 1316 5\' 2"  (1.575 m)     Head Circumference --      Peak Flow --      Pain Score 01/02/18 1313 8     Pain Loc --      Pain Edu? --      Excl. in GC? --     Constitutional: Alert and oriented. Well appearing and in no acute distress. Eyes: Conjunctivae are normal.  Head: Atraumatic. Nose: No congestion/rhinnorhea. Mouth/Throat: Mucous membranes are moist.   Neck:  supple no lymphadenopathy noted Cardiovascular: Normal rate, regular rhythm.  Respiratory: Normal respiratory effort.  No retractions GU: deferred Musculoskeletal: FROM all extremities, warm and well perfused.  Both index fingers are slightly tender to palpation.  No redness or swelling is noted.  Patient does have full range of motion. Neurologic:  Normal speech and language.  Skin:  Skin is warm, dry and intact. No rash noted. Psychiatric: Mood and affect are normal. Speech and behavior are normal.  ____________________________________________   LABS (all labs ordered are listed, but only abnormal results are displayed)  Labs Reviewed - No data to display  ____________________________________________   ____________________________________________  RADIOLOGY  X-ray of the left and right index fingers.  Her negative  ____________________________________________   PROCEDURES  Procedure(s) performed: Finger splint supplied by the tech  Procedures    ____________________________________________   INITIAL IMPRESSION / ASSESSMENT AND PLAN / ED COURSE  Pertinent labs & imaging results that were available during my care of the patient were reviewed by me and considered in my medical decision making (see chart for details).   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  Physical exam the right index fingers are tender.  Thumbs are not tender.  X-ray of the right and left index fingers is negative for any fracture.  Patient already had an finger splint for the left hand one was applied to the right hand by the tech.  She is to follow-up with the hand specialist at [facility].  Return emergency department worsening.  She was given a prescription for meloxicam that she is to take daily.  She is discharged in stable condition.     As part of my medical decision making, I reviewed the following data within the electronic MEDICAL RECORD NUMBER Nursing notes reviewed and incorporated, Old chart reviewed, Radiograph reviewed x-ray of the left and right index fingers are negative for fracture, Notes from prior ED visits and [place] Controlled Substance Database  ____________________________________________   FINAL CLINICAL IMPRESSION(S) / ED DIAGNOSES  Final diagnoses:  Pain involving joints of fingers of both hands      NEW MEDICATIONS STARTED DURING THIS VISIT:  Discharge Medication List as of 01/02/2018  2:41 PM    START taking these medications   Details  meloxicam (MOBIC) 15 MG tablet Take 1 tablet (15 mg total) by mouth daily., Starting Sun 01/02/2018, Until Mon 01/02/2019, Normal         Note:  This document was prepared using Dragon voice recognition software and may include unintentional dictation errors.    Faythe Ghee, PA-C 01/02/18 1653    Governor Rooks, MD 01/04/18 [phone]

## 2018-01-02 NOTE — ED Notes (Signed)
No WC profile in the computer. 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

## 2018-01-02 NOTE — Discharge Instructions (Addendum)
Follow-up with Dr. Samuel GermanyKrasinski's office.  Please call for an appointment.  Wear the finger splints for 2 to 3 days.  Return emergency department if worsening.  Take the medication as prescribed.

## 2018-01-02 NOTE — ED Triage Notes (Signed)
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

## 2018-07-19 ENCOUNTER — Other Ambulatory Visit: Payer: Self-pay

## 2018-07-19 ENCOUNTER — Other Ambulatory Visit: Admission: RE | Admit: 2018-07-19 | Payer: Self-pay | Source: Ambulatory Visit | Admitting: Family Medicine

## 2018-07-19 ENCOUNTER — Encounter: Payer: Self-pay | Admitting: Emergency Medicine

## 2018-07-19 ENCOUNTER — Other Ambulatory Visit
Admission: RE | Admit: 2018-07-19 | Discharge: 2018-07-19 | Disposition: A | Discharge status: Home / Self Care | Source: Ambulatory Visit | Attending: Family Medicine | Admitting: Family Medicine

## 2018-07-19 ENCOUNTER — Emergency Department: Payer: Self-pay

## 2018-07-19 DIAGNOSIS — Y999 Unspecified external cause status: Secondary | ICD-10-CM | POA: Insufficient documentation

## 2018-07-19 DIAGNOSIS — M25512 Pain in left shoulder: Secondary | ICD-10-CM | POA: Insufficient documentation

## 2018-07-19 DIAGNOSIS — R0789 Other chest pain: Secondary | ICD-10-CM | POA: Insufficient documentation

## 2018-07-19 DIAGNOSIS — R109 Unspecified abdominal pain: Secondary | ICD-10-CM | POA: Insufficient documentation

## 2018-07-19 DIAGNOSIS — Z79899 Other long term (current) drug therapy: Secondary | ICD-10-CM | POA: Insufficient documentation
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
## 2018-07-20 ENCOUNTER — Emergency Department: Payer: Self-pay

## 2018-07-20 ENCOUNTER — Emergency Department
Admission: EM | Admit: 2018-07-20 | Discharge: 2018-07-20 | Disposition: A | Discharge status: Home / Self Care | Payer: Self-pay | Attending: Emergency Medicine | Admitting: Emergency Medicine

## 2018-07-20 DIAGNOSIS — M25512 Pain in left shoulder: Secondary | ICD-10-CM

## 2018-07-20 DIAGNOSIS — R0789 Other chest pain: Secondary | ICD-10-CM

## 2018-07-20 LAB — POCT PREGNANCY, URINE: Preg Test, Ur: NEGATIVE

## 2018-07-20 MED ORDER — IOHEXOL 300 MG/ML  SOLN
100.0000 mL | Freq: Once | INTRAMUSCULAR | Status: AC | PRN
  Administered 2018-07-20: 04:00:00 100 mL via INTRAVENOUS

## 2018-07-20 MED ORDER — MORPHINE SULFATE (PF) 2 MG/ML IV SOLN
2.0000 mg | Freq: Once | INTRAVENOUS | Status: AC
  Administered 2018-07-20: 04:00:00 2 mg via INTRAVENOUS
  Filled 2018-07-20: qty 1

## 2018-07-20 MED ORDER — ONDANSETRON HCL 4 MG/2ML IJ SOLN
4.0000 mg | Freq: Once | INTRAMUSCULAR | Status: AC
  Administered 2018-07-20: 4 mg via INTRAVENOUS
  Filled 2018-07-20: qty 2

## 2018-07-20 MED ORDER — CYCLOBENZAPRINE HCL 10 MG PO TABS: 10.0000 mg | ORAL_TABLET | Freq: Three times a day (TID) | ORAL | 0 refills | Status: DC | PRN
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
## 2018-07-20 NOTE — ED Notes (Signed)
Patient discharged to home per MD order. Patient in stable condition, and deemed medically cleared by ED provider for discharge. Discharge instructions reviewed with patient/family using "Teach Back"; verbalized understanding of medication education and administration, and information about follow-up care. Denies further concerns. ° °

## 2018-07-20 NOTE — ED Notes (Signed)
Sling to left arm per EDP order.

## 2018-07-20 NOTE — ED Notes (Signed)
Patient transported to CT 
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
## 2018-07-22 ENCOUNTER — Other Ambulatory Visit: Payer: Self-pay

## 2018-07-22 ENCOUNTER — Encounter: Payer: Self-pay | Admitting: Emergency Medicine

## 2018-07-22 ENCOUNTER — Emergency Department
Admission: EM | Admit: 2018-07-22 | Discharge: 2018-07-22 | Disposition: A | Discharge status: Home / Self Care | Attending: Emergency Medicine | Admitting: Emergency Medicine

## 2018-07-22 DIAGNOSIS — S39012D Strain of muscle, fascia and tendon of lower back, subsequent encounter: Secondary | ICD-10-CM | POA: Insufficient documentation

## 2018-07-22 DIAGNOSIS — M25512 Pain in left shoulder: Secondary | ICD-10-CM | POA: Insufficient documentation

## 2018-07-22 DIAGNOSIS — M549 Dorsalgia, unspecified: Secondary | ICD-10-CM | POA: Insufficient documentation

## 2018-07-22 MED ORDER — TRAMADOL HCL 50 MG PO TABS: 50.0000 mg | ORAL_TABLET | Freq: Two times a day (BID) | ORAL | 0 refills | Status: DC | PRN

## 2018-07-22 MED ORDER — NAPROXEN 500 MG PO TABS: 500.0000 mg | ORAL_TABLET | Freq: Two times a day (BID) | ORAL | Status: DC
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
## 2018-07-22 NOTE — Discharge Instructions (Addendum)
Continue previous medications
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
## 2019-09-24 IMAGING — CR LEFT SHOULDER - 2+ VIEW
3 series · 3 of 3 positions shown · non-contrast
Comparison: None.

CLINICAL DATA: Pain

EXAM:
LEFT SHOULDER - 2+ VIEW

[shoulder grashey]
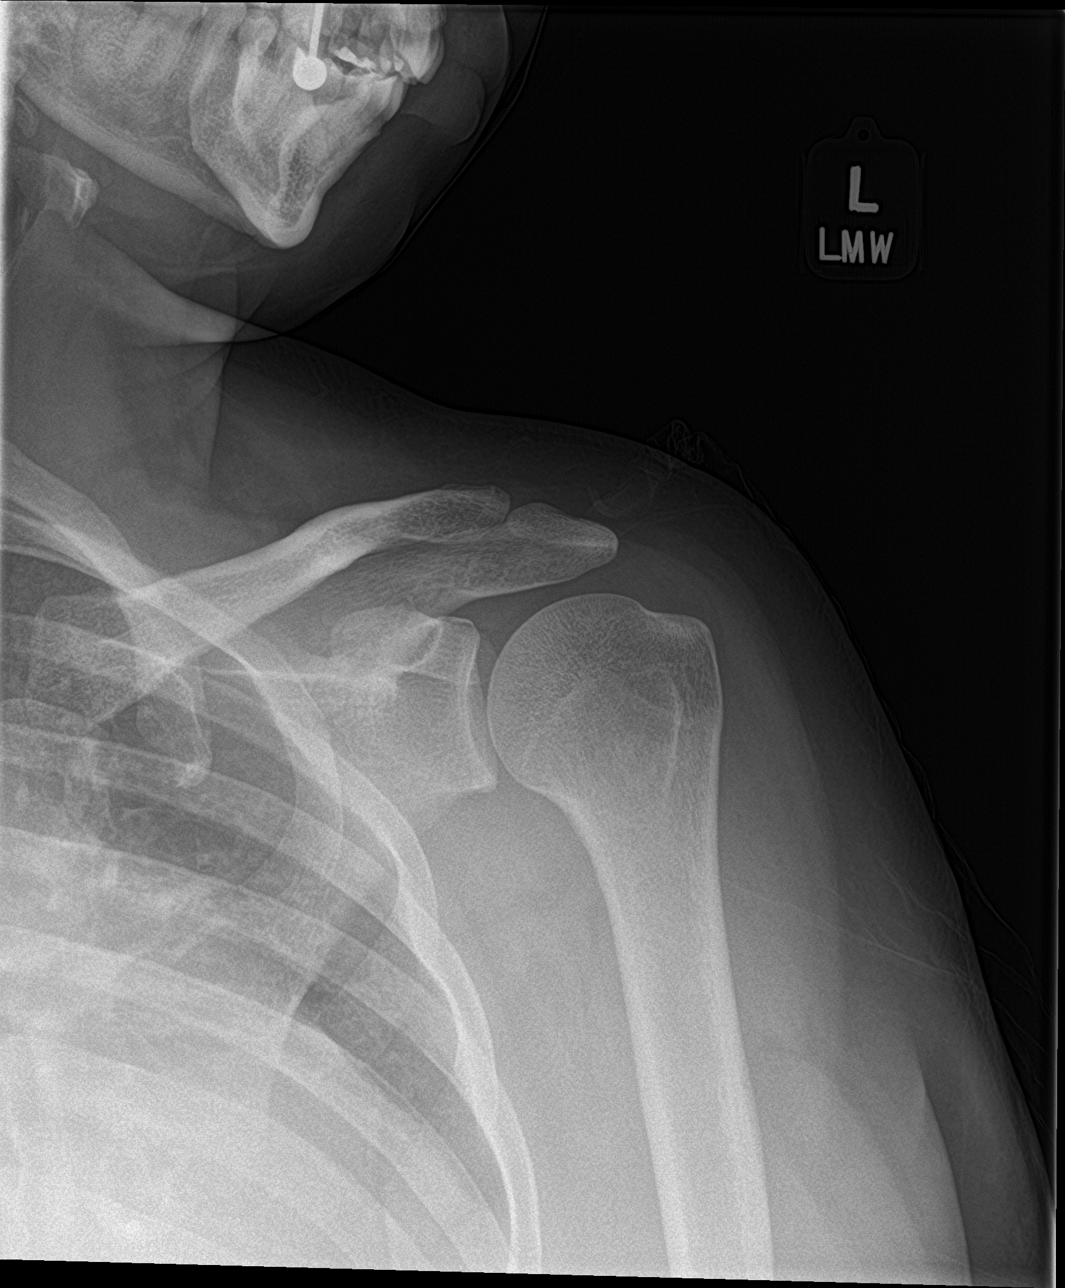

[shoulder y view]
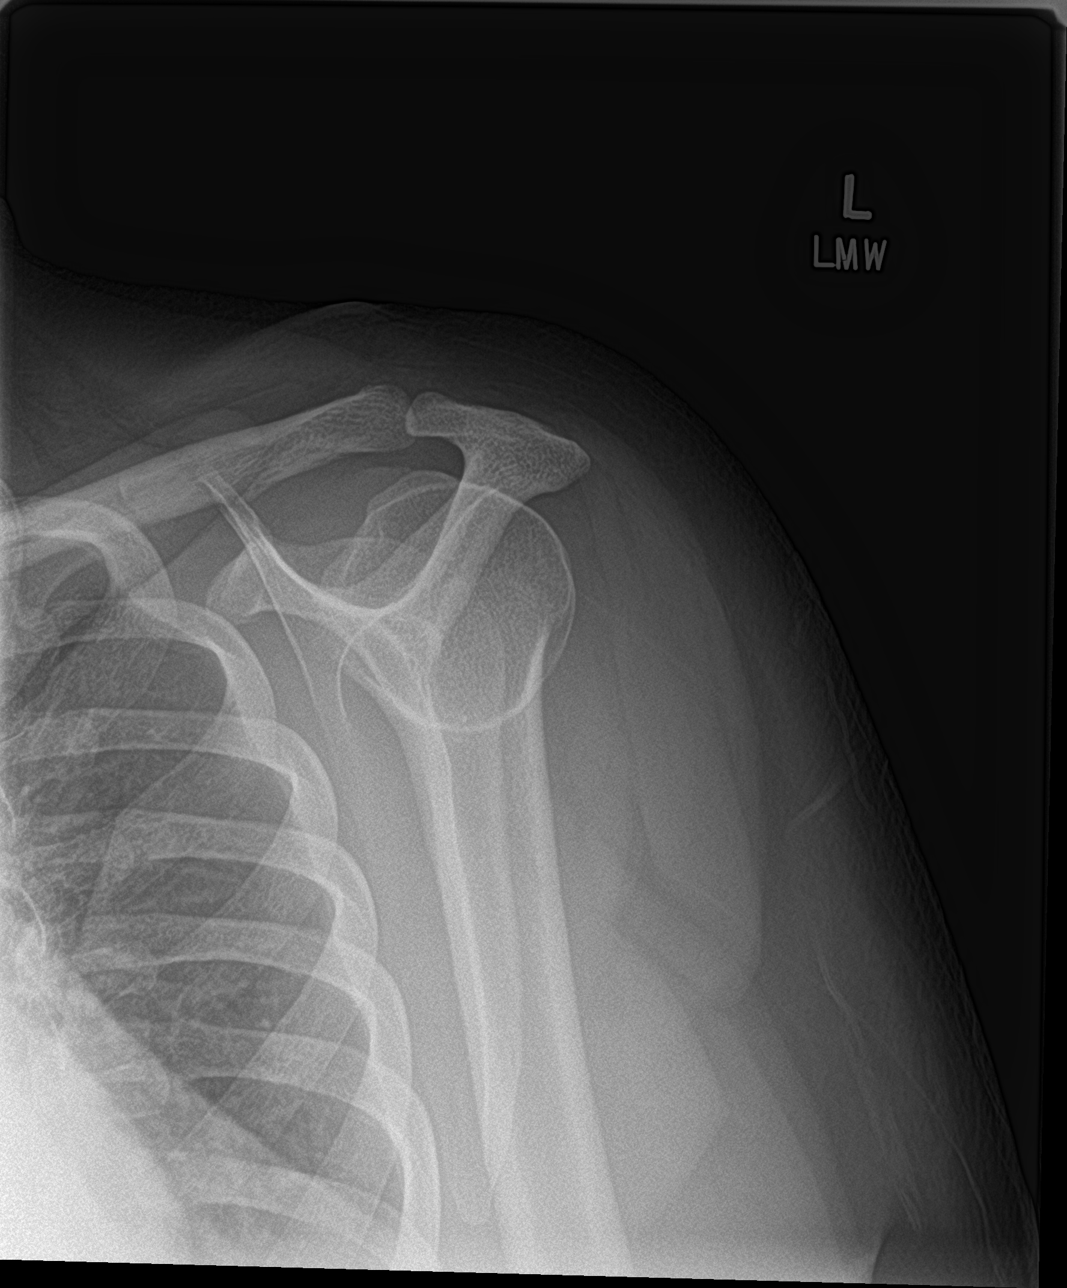

[shoulder axillary]
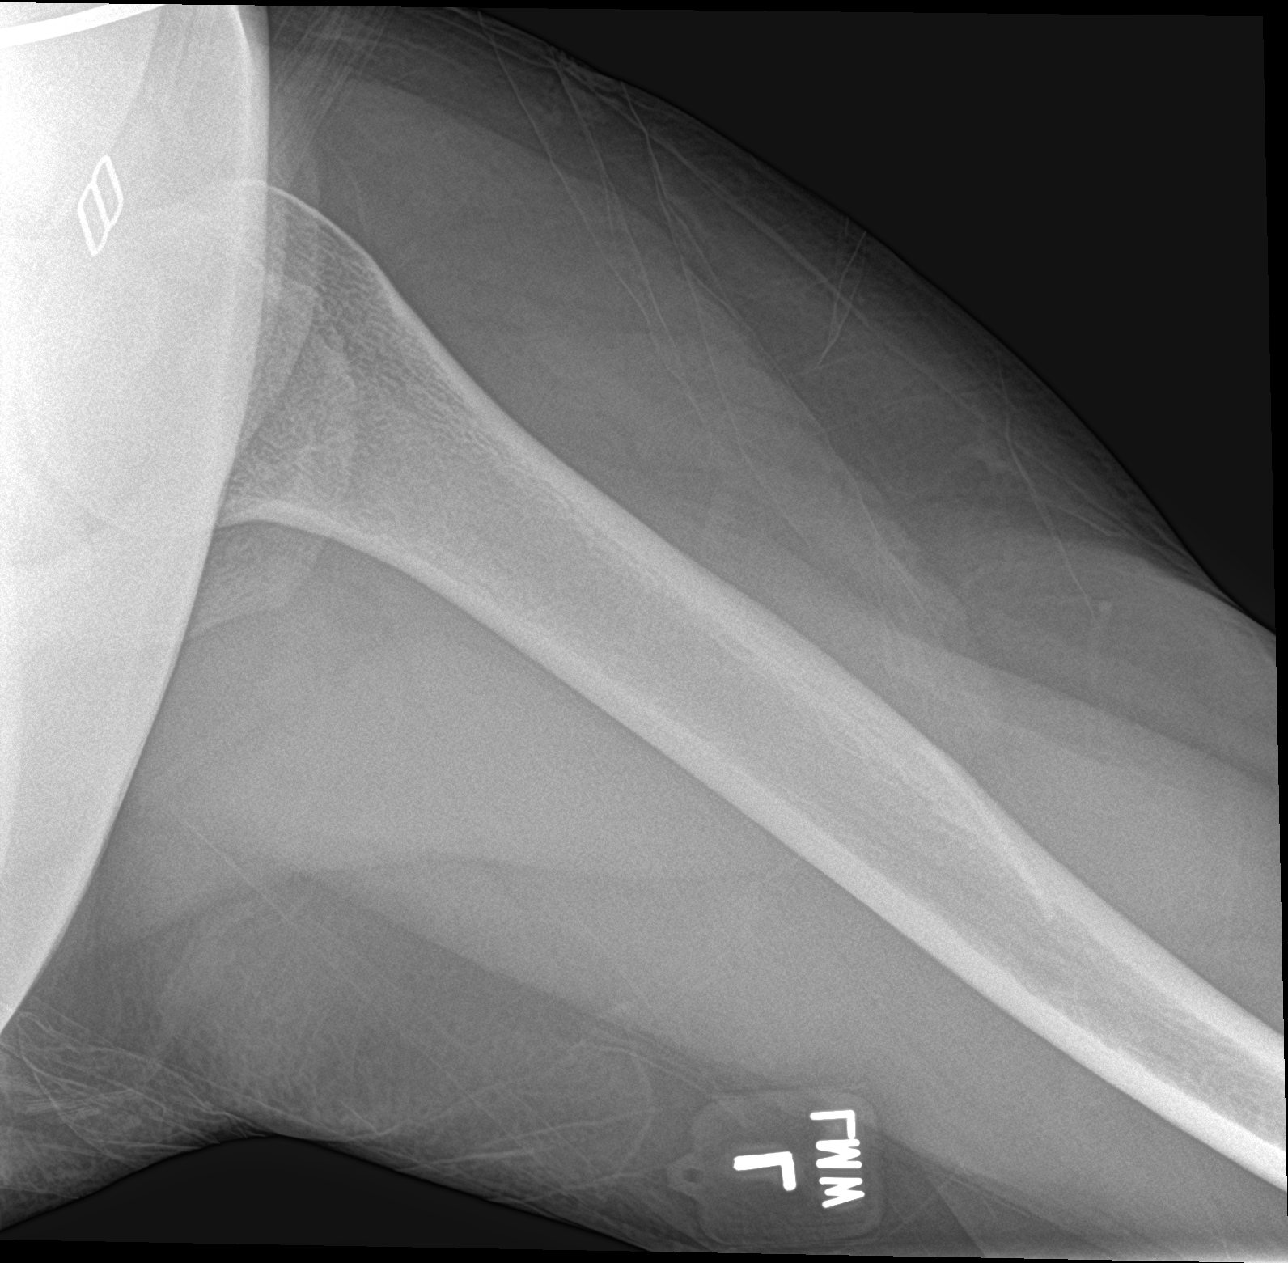

[3 of 3 positions shown; findings below may reference images not displayed]

FINDINGS: There is no acute displaced fracture or dislocation, however
evaluation was limited by suboptimal axillary view. The osseous
mineralization is within normal limits.
IMPRESSION: No acute osseous abnormality.

## 2019-11-06 ENCOUNTER — Emergency Department (HOSPITAL_COMMUNITY): Payer: No Typology Code available for payment source

## 2019-11-06 ENCOUNTER — Emergency Department (HOSPITAL_COMMUNITY)
Admission: EM | Admit: 2019-11-06 | Discharge: 2019-11-06 | Disposition: A | Discharge status: Home / Self Care | Payer: No Typology Code available for payment source | Attending: Emergency Medicine | Admitting: Emergency Medicine

## 2019-11-06 ENCOUNTER — Encounter (HOSPITAL_COMMUNITY): Payer: Self-pay | Admitting: Emergency Medicine

## 2019-11-06 ENCOUNTER — Other Ambulatory Visit: Payer: Self-pay

## 2019-11-06 DIAGNOSIS — S8012XA Contusion of left lower leg, initial encounter: Secondary | ICD-10-CM | POA: Insufficient documentation

## 2019-11-06 DIAGNOSIS — S79922A Unspecified injury of left thigh, initial encounter: Secondary | ICD-10-CM | POA: Diagnosis present

## 2019-11-06 DIAGNOSIS — S301XXA Contusion of abdominal wall, initial encounter: Secondary | ICD-10-CM | POA: Diagnosis not present

## 2019-11-06 DIAGNOSIS — S161XXA Strain of muscle, fascia and tendon at neck level, initial encounter: Secondary | ICD-10-CM | POA: Insufficient documentation

## 2019-11-06 DIAGNOSIS — S50812A Abrasion of left forearm, initial encounter: Secondary | ICD-10-CM | POA: Insufficient documentation

## 2019-11-06 DIAGNOSIS — S299XXA Unspecified injury of thorax, initial encounter: Secondary | ICD-10-CM

## 2019-11-06 DIAGNOSIS — Y92410 Unspecified street and highway as the place of occurrence of the external cause: Secondary | ICD-10-CM | POA: Diagnosis not present

## 2019-11-06 DIAGNOSIS — T07XXXA Unspecified multiple injuries, initial encounter: Secondary | ICD-10-CM

## 2019-11-06 LAB — BASIC METABOLIC PANEL
Anion gap: 8 (ref 5–15)
BUN: 9 mg/dL (ref 6–20)
CO2: 25 mmol/L (ref 22–32)
Calcium: 9 mg/dL (ref 8.9–10.3)
Chloride: 107 mmol/L (ref 98–111)
Creatinine, Ser: 0.79 mg/dL (ref 0.44–1.00)
GFR calc Af Amer: >60 mL/min (ref >60)
GFR calc non Af Amer: >60 mL/min (ref >60)
Glucose, Bld: 116 mg/dL — ABNORMAL HIGH (ref 70–99)
Potassium: 3.7 mmol/L (ref 3.5–5.1)
Sodium: 140 mmol/L (ref 135–145)

## 2019-11-06 LAB — CBC
HCT: 40.3 % (ref 36.0–46.0)
Hemoglobin: 13.3 g/dL (ref 12.0–15.0)
MCH: 30.3 pg (ref 26.0–34.0)
MCHC: 33 g/dL (ref 30.0–36.0)
MCV: 91.8 fL (ref 80.0–100.0)
Platelets: 284 10*3/uL (ref 150–400)
RBC: 4.39 MIL/uL (ref 3.87–5.11)
RDW: 12.8 % (ref 11.5–15.5)
WBC: 8.1 10*3/uL (ref 4.0–10.5)
nRBC: 0 % (ref 0.0–0.2)

## 2019-11-06 LAB — I-STAT BETA HCG BLOOD, ED (MC, WL, AP ONLY): I-stat hCG, quantitative: <5 m[IU]/mL (ref <5)

## 2019-11-06 MED ORDER — NAPROXEN 500 MG PO TABS: 500.0000 mg | ORAL_TABLET | Freq: Two times a day (BID) | ORAL | 0 refills | Status: DC

## 2019-11-06 MED ORDER — IOHEXOL 300 MG/ML  SOLN
100.0000 mL | Freq: Once | INTRAMUSCULAR | Status: AC | PRN
  Administered 2019-11-06: 100 mL via INTRAVENOUS

## 2019-11-06 MED ORDER — MORPHINE SULFATE (PF) 4 MG/ML IV SOLN
4.0000 mg | Freq: Once | INTRAVENOUS | Status: AC
  Administered 2019-11-06: 4 mg via INTRAVENOUS
  Filled 2019-11-06: qty 1

## 2019-11-06 MED ORDER — METHOCARBAMOL 500 MG PO TABS: 500.0000 mg | ORAL_TABLET | Freq: Two times a day (BID) | ORAL | 0 refills | Status: DC | PRN

## 2019-11-06 NOTE — Discharge Instructions (Signed)
Your testing today did not show any signs of broken bones or internal injuries, take naproxen twice a day as needed for pain, Robaxin twice a day for muscle spasm, rest, ice packs, this should get better over the next 10 days
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
## 2020-01-31 ENCOUNTER — Other Ambulatory Visit: Payer: Self-pay

## 2020-01-31 ENCOUNTER — Emergency Department (HOSPITAL_COMMUNITY): Payer: Self-pay

## 2020-01-31 ENCOUNTER — Emergency Department (HOSPITAL_COMMUNITY)
Admission: EM | Admit: 2020-01-31 | Discharge: 2020-01-31 | Disposition: A | Discharge status: Home / Self Care | Payer: Self-pay | Attending: Emergency Medicine | Admitting: Emergency Medicine

## 2020-01-31 ENCOUNTER — Encounter (HOSPITAL_COMMUNITY): Payer: Self-pay

## 2020-01-31 DIAGNOSIS — R52 Pain, unspecified: Secondary | ICD-10-CM

## 2020-01-31 DIAGNOSIS — M25512 Pain in left shoulder: Secondary | ICD-10-CM

## 2020-01-31 DIAGNOSIS — F1590 Other stimulant use, unspecified, uncomplicated: Secondary | ICD-10-CM | POA: Insufficient documentation

## 2020-01-31 MED ORDER — CELECOXIB 200 MG PO CAPS: 200.0000 mg | ORAL_CAPSULE | Freq: Two times a day (BID) | ORAL | 0 refills | Status: DC

## 2020-01-31 NOTE — Discharge Instructions (Addendum)
Contact a health care provider if: °Your pain gets worse. °Your pain is not relieved with medicines. °New pain develops in your arm, hand, or fingers. °Get help right away if: °Your arm, hand, or fingers: °Tingle. °Become numb. °Become swollen. °Become painful. °Turn white or blue. °
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
## 2020-05-13 ENCOUNTER — Encounter (HOSPITAL_COMMUNITY): Payer: Self-pay | Admitting: *Deleted

## 2020-05-13 ENCOUNTER — Emergency Department (HOSPITAL_COMMUNITY)
Admission: EM | Admit: 2020-05-13 | Discharge: 2020-05-13 | Disposition: A | Discharge status: Home / Self Care | Payer: Self-pay | Attending: Emergency Medicine | Admitting: Emergency Medicine

## 2020-05-13 ENCOUNTER — Other Ambulatory Visit: Payer: Self-pay

## 2020-05-13 DIAGNOSIS — N946 Dysmenorrhea, unspecified: Secondary | ICD-10-CM

## 2020-05-13 DIAGNOSIS — N939 Abnormal uterine and vaginal bleeding, unspecified: Secondary | ICD-10-CM | POA: Insufficient documentation

## 2020-05-13 DIAGNOSIS — R1084 Generalized abdominal pain: Secondary | ICD-10-CM | POA: Insufficient documentation

## 2020-05-13 LAB — URINALYSIS, ROUTINE W REFLEX MICROSCOPIC
Bacteria, UA: NONE SEEN
Bilirubin Urine: NEGATIVE
Glucose, UA: NEGATIVE mg/dL
Ketones, ur: NEGATIVE mg/dL
Leukocytes,Ua: NEGATIVE
Nitrite: NEGATIVE
Protein, ur: NEGATIVE mg/dL
Specific Gravity, Urine: 1.015 (ref 1.005–1.030)
pH: 7 (ref 5.0–8.0)

## 2020-05-13 LAB — CBC WITH DIFFERENTIAL/PLATELET
Abs Immature Granulocytes: 0.02 10*3/uL (ref 0.00–0.07)
Basophils Absolute: 0.1 10*3/uL (ref 0.0–0.1)
Basophils Relative: 1 %
Eosinophils Absolute: 0.3 10*3/uL (ref 0.0–0.5)
Eosinophils Relative: 6 %
HCT: 42.1 % (ref 36.0–46.0)
Hemoglobin: 13.6 g/dL (ref 12.0–15.0)
Immature Granulocytes: 0 %
Lymphocytes Relative: 32 %
Lymphs Abs: 1.8 10*3/uL (ref 0.7–4.0)
MCH: 29.8 pg (ref 26.0–34.0)
MCHC: 32.3 g/dL (ref 30.0–36.0)
MCV: 92.3 fL (ref 80.0–100.0)
Monocytes Absolute: 0.5 10*3/uL (ref 0.1–1.0)
Monocytes Relative: 8 %
Neutro Abs: 2.9 10*3/uL (ref 1.7–7.7)
Neutrophils Relative %: 53 %
Platelets: 264 10*3/uL (ref 150–400)
RBC: 4.56 MIL/uL (ref 3.87–5.11)
RDW: 12.6 % (ref 11.5–15.5)
WBC: 5.6 10*3/uL (ref 4.0–10.5)
nRBC: 0 % (ref 0.0–0.2)

## 2020-05-13 LAB — COMPREHENSIVE METABOLIC PANEL
ALT: 13 U/L (ref 0–44)
AST: 19 U/L (ref 15–41)
Albumin: 3.9 g/dL (ref 3.5–5.0)
Alkaline Phosphatase: 87 U/L (ref 38–126)
Anion gap: 10 (ref 5–15)
BUN: 11 mg/dL (ref 6–20)
CO2: 24 mmol/L (ref 22–32)
Calcium: 9.1 mg/dL (ref 8.9–10.3)
Chloride: 104 mmol/L (ref 98–111)
Creatinine, Ser: 0.82 mg/dL (ref 0.44–1.00)
GFR, Estimated: >60 mL/min (ref >60)
Glucose, Bld: 108 mg/dL — ABNORMAL HIGH (ref 70–99)
Potassium: 3.9 mmol/L (ref 3.5–5.1)
Sodium: 138 mmol/L (ref 135–145)
Total Bilirubin: 0.4 mg/dL (ref 0.3–1.2)
Total Protein: 7.3 g/dL (ref 6.5–8.1)

## 2020-05-13 LAB — HCG, QUANTITATIVE, PREGNANCY: hCG, Beta Chain, Quant, S: <1 m[IU]/mL (ref <5)

## 2020-05-13 LAB — PREGNANCY, URINE: Preg Test, Ur: NEGATIVE

## 2020-05-13 NOTE — ED Notes (Signed)
Patient states she took a pregnancy test and was a week pregnant. C/o pain with urination and seeing some blood. Also has pain in entire pelvic area and pain is increased when walking.

## 2020-05-13 NOTE — ED Provider Notes (Signed)
[facility] EMERGENCY DEPARTMENT Provider Note   CSN: [id number] Arrival date & time: 05/13/20  1043     History No chief complaint on file.   Gabrielle Erickson is a 25 y.o. female.  Patient complains of lower abdominal pain with vaginal bleeding.  She thinks she is having a miscarriage  The history is provided by the patient and medical records. No language interpreter was used.  Abdominal Pain Pain location:  Generalized Pain quality: aching   Pain radiates to:  Does not radiate Pain severity:  Moderate Onset quality:  Sudden Timing:  Constant Associated symptoms: vaginal bleeding   Associated symptoms: no chest pain, no cough, no diarrhea, no fatigue and no hematuria        Past Medical History:  Diagnosis Date  . Arthritis   . Iron deficiency     There are no problems to display for this patient.   History reviewed. No pertinent surgical history.   OB History   No obstetric history on file.     No family history on file.  Social History   Tobacco Use  . Smoking status: Never Smoker  . Smokeless tobacco: Never Used  Vaping Use  . Vaping Use: Never used  Substance Use Topics  . Alcohol use: Yes    Comment: occ  . Drug use: Yes    Types: Marijuana    Home Medications Prior to Admission medications   Medication Sig Start Date End Date Taking? Authorizing Provider  celecoxib (CELEBREX) 200 MG capsule Take 1 capsule (200 mg total) by mouth 2 (two) times daily. 01/31/20   Arthor Captain, PA-C    Allergies    Patient has no known allergies.  Review of Systems   Review of Systems  Constitutional: Negative for appetite change and fatigue.  HENT: Negative for congestion, ear discharge and sinus pressure.   Eyes: Negative for discharge.  Respiratory: Negative for cough.   Cardiovascular: Negative for chest pain.  Gastrointestinal: Positive for abdominal pain. Negative for diarrhea.  Genitourinary: Positive for vaginal bleeding. Negative for  frequency and hematuria.  Musculoskeletal: Negative for back pain.  Skin: Negative for rash.  Neurological: Negative for seizures and headaches.  Psychiatric/Behavioral: Negative for hallucinations.    Physical Exam Updated Vital Signs BP (!) 108/53 (BP Location: Left Arm)   Pulse (!) 50   Temp 98 F (36.7 C)   Resp 18   Ht 5\' 2"  (1.575 m)   Wt 119.3 kg   LMP 05/01/2020   SpO2 100%   BMI 48.10 kg/m   Physical Exam Vitals and nursing note reviewed.  Constitutional:      Appearance: She is well-developed.  HENT:     Head: Normocephalic.     Nose: Nose normal.  Eyes:     General: No scleral icterus.    Conjunctiva/sclera: Conjunctivae normal.  Neck:     Thyroid: No thyromegaly.  Cardiovascular:     Rate and Rhythm: Normal rate and regular rhythm.     Heart sounds: No murmur heard. No friction rub. No gallop.   Pulmonary:     Breath sounds: No stridor. No wheezing or rales.  Chest:     Chest wall: No tenderness.  Abdominal:     General: There is no distension.     Tenderness: There is abdominal tenderness. There is no rebound.  Musculoskeletal:        General: Normal range of motion.     Cervical back: Neck supple.  Lymphadenopathy:  Cervical: No cervical adenopathy.  Skin:    Findings: No erythema or rash.  Neurological:     Mental Status: She is oriented to person, place, and time.     Motor: No abnormal muscle tone.     Coordination: Coordination normal.  Psychiatric:        Behavior: Behavior normal.     ED Results / Procedures / Treatments   Labs (all labs ordered are listed, but only abnormal results are displayed) Labs Reviewed  COMPREHENSIVE METABOLIC PANEL - Abnormal; Notable for the following components:      Result Value   Glucose, Bld 108 (*)    All other components within normal limits  URINALYSIS, ROUTINE W REFLEX MICROSCOPIC - Abnormal; Notable for the following components:   Hgb urine dipstick SMALL (*)    All other components  within normal limits  CBC WITH DIFFERENTIAL/PLATELET  PREGNANCY, URINE  HCG, QUANTITATIVE, PREGNANCY    EKG None  Radiology No results found.  Procedures Procedures   Medications Ordered in ED Medications - No data to display  ED Course  I have reviewed the triage vital signs and the nursing notes.  Pertinent labs & imaging results that were available during my care of the patient were reviewed by me and considered in my medical decision making (see chart for details).    MDM Rules/Calculators/A&P                          Patient with vaginal bleeding and negative pregnancy test.  Patient was diagnosed with painful menses and told to take Motrin follow-up Final Clinical Impression(s) / ED Diagnoses Final diagnoses:  Menses painful    Rx / DC Orders ED Discharge Orders    None       Bethann Berkshire, MD 05/13/20 1354

## 2020-05-13 NOTE — Discharge Instructions (Signed)
Take Motrin or Tylenol for pain and follow-up with your doctor if any problem

## 2021-01-28 ENCOUNTER — Other Ambulatory Visit: Payer: Self-pay

## 2021-01-28 ENCOUNTER — Encounter (HOSPITAL_COMMUNITY): Payer: Self-pay | Admitting: *Deleted

## 2021-01-28 ENCOUNTER — Emergency Department (HOSPITAL_COMMUNITY): Payer: No Typology Code available for payment source

## 2021-01-28 ENCOUNTER — Emergency Department (HOSPITAL_COMMUNITY)
Admission: EM | Admit: 2021-01-28 | Discharge: 2021-01-28 | Disposition: A | Discharge status: Home / Self Care | Payer: No Typology Code available for payment source | Attending: Emergency Medicine | Admitting: Emergency Medicine

## 2021-01-28 DIAGNOSIS — Y92410 Unspecified street and highway as the place of occurrence of the external cause: Secondary | ICD-10-CM | POA: Diagnosis not present

## 2021-01-28 DIAGNOSIS — O26892 Other specified pregnancy related conditions, second trimester: Secondary | ICD-10-CM | POA: Diagnosis not present

## 2021-01-28 DIAGNOSIS — M25561 Pain in right knee: Secondary | ICD-10-CM

## 2021-01-28 DIAGNOSIS — Z3A00 Weeks of gestation of pregnancy not specified: Secondary | ICD-10-CM | POA: Insufficient documentation

## 2021-01-28 MED ORDER — ACETAMINOPHEN 500 MG PO TABS
1000.0000 mg | ORAL_TABLET | Freq: Once | ORAL | Status: AC
  Administered 2021-01-28: 22:00:00 1000 mg via ORAL
  Filled 2021-01-28: qty 2
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
## 2021-01-28 NOTE — Discharge Instructions (Signed)
Take Tylenol for pain.  Keep the knee elevated during the day and ice for the first 3 days. Follow-up with the OB/GYN as planned in January. Return if you start having abdominal pain or vaginal bleeding.
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
## 2022-04-18 LAB — PANORAMA PRENATAL TEST FULL PANEL:PANORAMA TEST PLUS 5 ADDITIONAL MICRODELETIONS: FETAL FRACTION: 6.3

## 2022-07-07 ENCOUNTER — Emergency Department (HOSPITAL_COMMUNITY)
Admission: EM | Admit: 2022-07-07 | Discharge: 2022-07-07 | Discharge status: Against Medical Advice | Payer: Medicaid Other | Source: Home / Self Care

## 2022-09-04 ENCOUNTER — Inpatient Hospital Stay (HOSPITAL_COMMUNITY)
Admission: AD | Admit: 2022-09-04 | Discharge: 2022-09-04 | Disposition: A | Discharge status: Home / Self Care | Payer: Medicaid Other | Attending: Obstetrics and Gynecology | Admitting: Obstetrics and Gynecology

## 2022-09-04 ENCOUNTER — Encounter (HOSPITAL_COMMUNITY): Payer: Self-pay | Admitting: Emergency Medicine

## 2022-09-04 ENCOUNTER — Other Ambulatory Visit: Payer: Self-pay

## 2022-09-04 DIAGNOSIS — Z3A32 32 weeks gestation of pregnancy: Secondary | ICD-10-CM | POA: Diagnosis not present

## 2022-09-04 DIAGNOSIS — Z791 Long term (current) use of non-steroidal anti-inflammatories (NSAID): Secondary | ICD-10-CM | POA: Insufficient documentation

## 2022-09-04 DIAGNOSIS — O99413 Diseases of the circulatory system complicating pregnancy, third trimester: Secondary | ICD-10-CM | POA: Diagnosis not present

## 2022-09-04 DIAGNOSIS — O26893 Other specified pregnancy related conditions, third trimester: Secondary | ICD-10-CM | POA: Insufficient documentation

## 2022-09-04 DIAGNOSIS — O212 Late vomiting of pregnancy: Secondary | ICD-10-CM | POA: Insufficient documentation

## 2022-09-04 DIAGNOSIS — R102 Pelvic and perineal pain: Secondary | ICD-10-CM | POA: Insufficient documentation

## 2022-09-04 DIAGNOSIS — Z0371 Encounter for suspected problem with amniotic cavity and membrane ruled out: Secondary | ICD-10-CM

## 2022-09-04 DIAGNOSIS — R112 Nausea with vomiting, unspecified: Secondary | ICD-10-CM | POA: Insufficient documentation

## 2022-09-04 DIAGNOSIS — Z3689 Encounter for other specified antenatal screening: Secondary | ICD-10-CM

## 2022-09-04 DIAGNOSIS — O26899 Other specified pregnancy related conditions, unspecified trimester: Secondary | ICD-10-CM

## 2022-09-04 LAB — LIPASE, BLOOD: Lipase: 23 U/L (ref 11–51)

## 2022-09-04 LAB — COMPREHENSIVE METABOLIC PANEL
ALT: 11 U/L (ref 0–44)
AST: 14 U/L — ABNORMAL LOW (ref 15–41)
Albumin: 2.9 g/dL — ABNORMAL LOW (ref 3.5–5.0)
Alkaline Phosphatase: 87 U/L (ref 38–126)
Anion gap: 10 (ref 5–15)
BUN: 5 mg/dL — ABNORMAL LOW (ref 6–20)
CO2: 20 mmol/L — ABNORMAL LOW (ref 22–32)
Calcium: 8.4 mg/dL — ABNORMAL LOW (ref 8.9–10.3)
Chloride: 103 mmol/L (ref 98–111)
Creatinine, Ser: 0.47 mg/dL (ref 0.44–1.00)
GFR, Estimated: >60 mL/min (ref >60)
Glucose, Bld: 94 mg/dL (ref 70–99)
Potassium: 2.9 mmol/L — ABNORMAL LOW (ref 3.5–5.1)
Sodium: 133 mmol/L — ABNORMAL LOW (ref 135–145)
Total Bilirubin: 0.7 mg/dL (ref 0.3–1.2)
Total Protein: 6.7 g/dL (ref 6.5–8.1)

## 2022-09-04 LAB — WET PREP, GENITAL
Clue Cells Wet Prep HPF POC: NONE SEEN
Sperm: NONE SEEN
Trich, Wet Prep: NONE SEEN
WBC, Wet Prep HPF POC: >=10 — AB (ref <10)
Yeast Wet Prep HPF POC: NONE SEEN

## 2022-09-04 LAB — CBC
HCT: 32.8 % — ABNORMAL LOW (ref 36.0–46.0)
Hemoglobin: 11.1 g/dL — ABNORMAL LOW (ref 12.0–15.0)
MCH: 29.4 pg (ref 26.0–34.0)
MCHC: 33.8 g/dL (ref 30.0–36.0)
MCV: 87 fL (ref 80.0–100.0)
Platelets: 256 10*3/uL (ref 150–400)
RBC: 3.77 MIL/uL — ABNORMAL LOW (ref 3.87–5.11)
RDW: 13.3 % (ref 11.5–15.5)
WBC: 8.3 10*3/uL (ref 4.0–10.5)
nRBC: 0 % (ref 0.0–0.2)

## 2022-09-04 LAB — TYPE AND SCREEN
ABO/RH(D): O POS
Antibody Screen: NEGATIVE

## 2022-09-04 MED ORDER — SODIUM CHLORIDE 0.9 % IV BOLUS
1000.0000 mL | Freq: Once | INTRAVENOUS | Status: AC
  Administered 2022-09-04: 1000 mL via INTRAVENOUS

## 2022-09-04 NOTE — MAU Provider Note (Signed)
History     CSN: [id number]  Arrival date and time: 09/04/22 1257   None     Chief Complaint  Patient presents with   Rupture of Membranes   Gabrielle Erickson is a 27 y.o. G2P0 at [redacted]w[redacted]d who receives care at [facility].  Patient reports her next appt is Monday.  She presents today for discharge and concern for ROM.  She reports it has been ongoing since Sunday, but was only with wiping.  She sates it is clear glossy with mucous type consistency. She reports that her discharge has been consistent, but slowed down about 2 days. She denies other vaginal concerns.  She denies issues with urination. No recent sexual activity.    OB History     Gravida  2   Para      Term      Preterm      AB      Living         SAB      IAB      Ectopic      Multiple      Live Births              Past Medical History:  Diagnosis Date   Arthritis    Iron deficiency     History reviewed. No pertinent surgical history.  History reviewed. No pertinent family history.  Social History   Tobacco Use   Smoking status: Never   Smokeless tobacco: Never  Vaping Use   Vaping status: Never Used  Substance Use Topics   Alcohol use: Not Currently    Comment: occ   Drug use: Not Currently    Types: Marijuana    Allergies: No Known Allergies  Medications Prior to Admission  Medication Sig Dispense Refill Last Dose   aspirin EC 81 MG tablet Take 81 mg by mouth daily. Swallow whole.   Past Week   prenatal vitamin w/FE, FA (PRENATAL 1 + 1) 27-1 MG TABS tablet Take 1 tablet by mouth daily at 12 noon.   Past Week   celecoxib (CELEBREX) 200 MG capsule Take 1 capsule (200 mg total) by mouth 2 (two) times daily. 20 capsule 0     Review of Systems  Constitutional:  Negative for chills and fever.  Gastrointestinal:  Positive for abdominal pain (Cramping til 1pm), diarrhea (Watery x 2 days, but not with every BM) and vomiting. Negative for constipation and nausea.   Genitourinary:  Negative for difficulty urinating, dysuria, vaginal bleeding and vaginal discharge.  Neurological:  Negative for dizziness, light-headedness and headaches.   Physical Exam   Blood pressure 113/63, pulse 92, temperature 98 F (36.7 C), resp. rate 18, height 5\' 2"  (1.575 m), weight 117.9 kg, SpO2 96%, not currently breastfeeding.  Physical Exam Vitals reviewed. Exam conducted with a chaperone present.  Constitutional:      Appearance: Normal appearance. She is obese.  HENT:     Head: Normocephalic and atraumatic.  Eyes:     Conjunctiva/sclera: Conjunctivae normal.  Cardiovascular:     Rate and Rhythm: Normal rate.  Pulmonary:     Effort: Pulmonary effort is normal. No respiratory distress.  Abdominal:     Comments: Gravid--fundal height appears SGA, Soft, NT   Genitourinary:    General: Normal vulva.     Comments: Speculum Exam: -Normal External Genitalia: Non tender, grayish white discharge at introitus.  -Vaginal Vault: Pink mucosa with good rugae. No pooling, scant amt discharge. -wet prep and fern collected -  Cervix:Pink, no lesions, cysts, or polyps.  Appears closed. No active bleeding or leaking from os-GC/CT collected -Bimanual Exam:  Deferred   Musculoskeletal:     Cervical back: Normal range of motion.  Skin:    General: Skin is warm and dry.  Neurological:     Mental Status: She is alert and oriented to person, place, and time.  Psychiatric:        Mood and Affect: Mood normal.        Behavior: Behavior normal.     Fetal Assessment 145 bpm, Mod Var, -Decels, +Accels Toco: No ctx graphed  MAU Course   Results for orders placed or performed during the hospital encounter of 09/04/22 (from the past 24 hour(s))  CBC     Status: Abnormal   Collection Time: 09/04/22  1:15 PM  Result Value Ref Range   WBC 8.3 4.0 - 10.5 K/uL   RBC 3.77 (L) 3.87 - 5.11 MIL/uL   Hemoglobin 11.1 (L) 12.0 - 15.0 g/dL   HCT 16.1 (L) 09.6 - 04.5 %   MCV 87.0 80.0 -  100.0 fL   MCH 29.4 26.0 - 34.0 pg   MCHC 33.8 30.0 - 36.0 g/dL   RDW 40.9 81.1 - 91.4 %   Platelets 256 150 - 400 K/uL   nRBC 0.0 0.0 - 0.2 %  Comprehensive metabolic panel     Status: Abnormal   Collection Time: 09/04/22  1:15 PM  Result Value Ref Range   Sodium 133 (L) 135 - 145 mmol/L   Potassium 2.9 (L) 3.5 - 5.1 mmol/L   Chloride 103 98 - 111 mmol/L   CO2 20 (L) 22 - 32 mmol/L   Glucose, Bld 94 70 - 99 mg/dL   BUN 5 (L) 6 - 20 mg/dL   Creatinine, Ser 7.82 0.44 - 1.00 mg/dL   Calcium 8.4 (L) 8.9 - 10.3 mg/dL   Total Protein 6.7 6.5 - 8.1 g/dL   Albumin 2.9 (L) 3.5 - 5.0 g/dL   AST 14 (L) 15 - 41 U/L   ALT 11 0 - 44 U/L   Alkaline Phosphatase 87 38 - 126 U/L   Total Bilirubin 0.7 0.3 - 1.2 mg/dL   GFR, Estimated >95 >62 mL/min   Anion gap 10 5 - 15  Lipase, blood     Status: None   Collection Time: 09/04/22  1:51 PM  Result Value Ref Range   Lipase 23 11 - 51 U/L  Type and screen [facility]     Status: None   Collection Time: 09/04/22  2:21 PM  Result Value Ref Range   ABO/RH(D) O POS    Antibody Screen NEG    Sample Expiration      09/07/2022,2359 Performed at [facility], [address] [place], [place] [address]   Wet prep, genital     Status: Abnormal   Collection Time: 09/04/22  8:28 PM   Specimen: Cervix  Result Value Ref Range   Yeast Wet Prep HPF POC NONE SEEN NONE SEEN   Trich, Wet Prep NONE SEEN NONE SEEN   Clue Cells Wet Prep HPF POC NONE SEEN NONE SEEN   WBC, Wet Prep HPF POC >=10 (A) <10   Sperm NONE SEEN    No results found.  MDM PE Cultures: Wet Prep, GC/CT EFM  Assessment and Plan  27 year old G2P0  SIUP at 32.2 weeks Cat I FT R/O ROM   -POC Reviewed -Exam performed and findings discussed. -Cultures collected  and pending. -NST reactive. Okay to discontinue monitoring. -Await results and reassess.    Cherre Robins MSN, CNM 09/04/2022, 8:12 PM   Reassessment (9:14 PM) -Wet prep returns negative. -Nurse to give  results.  -Keep next appt as scheduled. -Return to MAU as appropriate.  Cherre Robins MSN, CNM Advanced Practice Provider, [facility]

## 2022-09-04 NOTE — ED Notes (Signed)
Pt placed on toco and fetal monitor, FHR 140s, call placed to OB Rapid Response Nurse for monitoring, spoke with Denny Peon, RN

## 2022-09-04 NOTE — Progress Notes (Signed)
RROB called and asked AP RN to adjust fhr monitors.  RROB was then informed that pt would like to go to [facility] to be assessed since that is where she goes to get [facility].  They are in the process of talking to those doctors and have her moved there instead.  Dr Alysia Penna and MAU made aware. [facility] says they will adjust EFM.

## 2022-09-04 NOTE — Progress Notes (Signed)
MAU called and given report

## 2022-09-04 NOTE — ED Notes (Signed)
Call placed to [facility] for Dr. Jimmey Ralph, on call OBGYN, for possible transfer per pt request

## 2022-09-04 NOTE — Progress Notes (Signed)
Pt reports to [facility] ED complaining of N/V/D as well as some dizziness since earlier today.  She is G2P0 at 32.[redacted]wks pregnant and receives her [facility] at [facility].  Pt reports positive fetal movement and denies vaginal bleeding.  She does says that she has had some vaginal discharge which she believes to be part of her mucous plug.  EFM applied and RROB will monitor remotely.  Dr Alysia Penna made aware and says that pt will have to be seen in MAU to rule out rupture.  [facility] notified and they will make plans for the transfer.

## 2022-09-04 NOTE — MAU Note (Signed)
Pt sent from [facility]. Went there for heartburn and nausea. Given some IV fluids  feeling a little better . Stated she still feels a little dehydrated.Sent to MAU because she had reports having a mucusy like discharge for the past few days. Need to R/O SROM. Pt  denies any pain or discomfort and reports good fetal movement. BP 113/63   Pulse 92   Temp 98 F (36.7 C)   Resp 18   Ht 5\' 2"  (1.575 m)   Wt 117.9 kg   SpO2 96%   Breastfeeding No   BMI 47.55 kg/m

## 2022-09-04 NOTE — ED Provider Notes (Signed)
[place] EMERGENCY DEPARTMENT AT [facility] Provider Note   CSN: [id number] Arrival date & time: 09/04/22  1257     History  No chief complaint on file.   Gabrielle Erickson is a 27 y.o. female.  She is a G2 P1 32-week pregnant female.  She said she has been having a lot of heartburn over the last few days and has not had much of an appetite.  Today she was a little hungry so she went to [facility] which she then proceeded to vomit and became diaphoretic and dizzy.  She has been having some ongoing sharp stabbing pelvic pain intermittently for the last week.  She is also noticed that there is more of a mucousy sheen when she wipes after urinating.  No gush of fluid no bleeding.  No fevers.  She has an appointment with her doctors at [facility] on Monday.  The history is provided by the patient.  Emesis Severity:  Moderate Duration:  1 hour Timing:  Constant Quality:  Stomach contents Progression:  Resolved Chronicity:  New Relieved by:  None tried Worsened by:  Nothing Ineffective treatments:  None tried Associated symptoms: abdominal pain   Associated symptoms: no diarrhea and no fever   Risk factors: pregnant        Home Medications Prior to Admission medications   Medication Sig Start Date End Date Taking? Authorizing Provider  celecoxib (CELEBREX) 200 MG capsule Take 1 capsule (200 mg total) by mouth 2 (two) times daily. 01/31/20   Arthor Captain, PA-C      Allergies    Patient has no known allergies.    Review of Systems   Review of Systems  Constitutional:  Negative for fever.  Respiratory:  Negative for shortness of breath.   Cardiovascular:  Negative for chest pain.  Gastrointestinal:  Positive for abdominal pain and vomiting. Negative for diarrhea.  Genitourinary:  Positive for pelvic pain. Negative for dysuria and vaginal bleeding.  Neurological:  Positive for dizziness.    Physical Exam Updated Vital Signs BP 117/67 (BP Location: Right Arm)    Pulse 88   Temp 98.3 F (36.8 C) (Oral)   Resp 20   Ht 5\' 2"  (1.575 m)   Wt 113.9 kg   SpO2 98%   Breastfeeding No   BMI 45.91 kg/m  Physical Exam Vitals and nursing note reviewed.  Constitutional:      General: She is not in acute distress.    Appearance: Normal appearance. She is well-developed.  HENT:     Head: Normocephalic and atraumatic.  Eyes:     Conjunctiva/sclera: Conjunctivae normal.  Cardiovascular:     Rate and Rhythm: Normal rate and regular rhythm.     Heart sounds: No murmur heard. Pulmonary:     Effort: Pulmonary effort is normal. No respiratory distress.     Breath sounds: Normal breath sounds.  Abdominal:     Palpations: Abdomen is soft. There is mass (gravid).     Tenderness: There is no abdominal tenderness. There is no guarding or rebound.  Musculoskeletal:        General: Normal range of motion.     Cervical back: Neck supple.     Right lower leg: No edema.     Left lower leg: No edema.  Skin:    General: Skin is warm and dry.     Capillary Refill: Capillary refill takes less than 2 seconds.  Neurological:     General: No focal deficit present.  Mental Status: She is alert.     Sensory: No sensory deficit.     Motor: No weakness.     ED Results / Procedures / Treatments   Labs (all labs ordered are listed, but only abnormal results are displayed) Labs Reviewed  CBC - Abnormal; Notable for the following components:      Result Value   RBC 3.77 (*)    Hemoglobin 11.1 (*)    HCT 32.8 (*)    All other components within normal limits  COMPREHENSIVE METABOLIC PANEL - Abnormal; Notable for the following components:   Sodium 133 (*)    Potassium 2.9 (*)    CO2 20 (*)    BUN 5 (*)    Calcium 8.4 (*)    Albumin 2.9 (*)    AST 14 (*)    All other components within normal limits  LIPASE, BLOOD  URINALYSIS, ROUTINE W REFLEX MICROSCOPIC  TYPE AND SCREEN    EKG EKG Interpretation Date/Time:  Friday September 04 2022 13:20:30 EDT Ventricular  Rate:  77 PR Interval:  144 QRS Duration:  76 QT Interval:  368 QTC Calculation: 416 R Axis:   69  Text Interpretation: Normal sinus rhythm with sinus arrhythmia Normal ECG When compared with ECG of 21-Jul-2017 14:28, No significant change was found Confirmed by Meridee Score [phone]) on 09/04/2022 1:22:54 PM  Radiology No results found.  Procedures Procedures    Medications Ordered in ED Medications  sodium chloride 0.9 % bolus 1,000 mL (has no administration in time range)    ED Course/ Medical Decision Making/ A&P Clinical Course as of 09/04/22 1710  Fri Sep 04, 2022  1353 Patient was placed on fetal monitoring on arrival.  Heart rate 150s.  She is also feeling the baby move. [MB]  1415 Dr. Arlyn Leak from [facility] is recommended the patient come down to [facility] for further evaluation.  I reviewed this with the patient and she would rather go to [facility] ED where she has established her OB care. [MB]  1445 Discussed with Dr. Jimmey Ralph from [facility].  Her concern was if the patient is positive for rupture of membranes she would then need to transfer the patient to [facility].  She would recommend patient just go to [place] and get the evaluation.  I reviewed this with the patient and she is willing to go to [facility].  She wants to go by private vehicle and her husband is on the way up.  She otherwise feels well. [MB]    Clinical Course User Index [MB] Terrilee Files, MD                             Medical Decision Making Amount and/or Complexity of Data Reviewed Labs: ordered.   This patient complains of nausea and vomiting sharp pelvic pain possible rupture of membranes; this involves an extensive number of treatment Options and is a complaint that carries with it a high risk of complications and morbidity. The differential includes active labor, gastritis, peptic ulcer disease, reflux, UTI  I ordered, reviewed and interpreted labs, which included CBC with normal white count stable hemoglobin,  chemistries with low potassium low bicarb normal renal function, blood type O+ no indications for RhoGAM I ordered medication IV fluids and reviewed PMP when indicated. Previous records obtained and reviewed in epic including prior OB notes I consulted OB/GYN Dr. Alysia Penna and Dr. Jimmey Ralph and discussed lab and imaging findings and discussed disposition.  Cardiac monitoring reviewed, normal sinus rhythm Social determinants considered, patient is physically inactive Critical Interventions: None  After the interventions stated above, I reevaluated the patient and found patient to be in no distress Admission and further testing considered, she will need further evaluation by OB.  She wishes to go by private vehicle down to women's and children's at [facility] for further evaluation.         Final Clinical Impression(s) / ED Diagnoses Final diagnoses:  Pelvic pain in pregnant patient at greater than [redacted] weeks gestation  Nausea and vomiting, unspecified vomiting type    Rx / DC Orders ED Discharge Orders     None         Terrilee Files, MD 09/04/22 1712

## 2022-09-04 NOTE — ED Notes (Signed)
Pt instructed to drive directly to MAU at [facility].

## 2022-09-04 NOTE — Progress Notes (Signed)
Spoke with [facility] again and the [facility] hospital said they could not see a pt who was less than 35 wks and they typically transfer those pts to [facility].  Therefore, pt would like to come here and requests to come in her own vehicle.  Dr Alysia Penna says this is alright with him.  MAU made aware.  PT will come in her own vehicle to MAU

## 2022-09-04 NOTE — ED Triage Notes (Signed)
Pt via POV c/o dizziness and vomiting since lunchtime today. She is about [redacted]w[redacted]d pregnant and feels intermittently hot and lightheaded with sharp lower abdominal cramping and diarrhea since this afternoon. Pt also notes she has noticed abnormal vaginal discharge and thinks it may be part of her mucous plug. G2P1

## 2023-05-14 ENCOUNTER — Encounter (HOSPITAL_COMMUNITY): Payer: Self-pay

## 2023-05-14 ENCOUNTER — Emergency Department (HOSPITAL_COMMUNITY)

## 2023-05-14 ENCOUNTER — Emergency Department (HOSPITAL_COMMUNITY)
Admission: EM | Admit: 2023-05-14 | Discharge: 2023-05-14 | Disposition: A | Discharge status: Home / Self Care | Attending: Emergency Medicine | Admitting: Emergency Medicine

## 2023-05-14 ENCOUNTER — Other Ambulatory Visit: Payer: Self-pay

## 2023-05-14 DIAGNOSIS — Z7982 Long term (current) use of aspirin: Secondary | ICD-10-CM | POA: Insufficient documentation

## 2023-05-14 DIAGNOSIS — S8002XA Contusion of left knee, initial encounter: Secondary | ICD-10-CM | POA: Diagnosis not present

## 2023-05-14 DIAGNOSIS — Y9389 Activity, other specified: Secondary | ICD-10-CM | POA: Diagnosis not present

## 2023-05-14 DIAGNOSIS — W19XXXA Unspecified fall, initial encounter: Secondary | ICD-10-CM

## 2023-05-14 DIAGNOSIS — S8992XA Unspecified injury of left lower leg, initial encounter: Secondary | ICD-10-CM | POA: Diagnosis present

## 2023-05-14 DIAGNOSIS — W010XXA Fall on same level from slipping, tripping and stumbling without subsequent striking against object, initial encounter: Secondary | ICD-10-CM | POA: Diagnosis not present

## 2023-05-14 DIAGNOSIS — M25512 Pain in left shoulder: Secondary | ICD-10-CM | POA: Diagnosis not present

## 2023-05-14 DIAGNOSIS — S40012A Contusion of left shoulder, initial encounter: Secondary | ICD-10-CM | POA: Insufficient documentation

## 2023-05-14 DIAGNOSIS — Y92512 Supermarket, store or market as the place of occurrence of the external cause: Secondary | ICD-10-CM | POA: Diagnosis not present

## 2023-05-14 MED ORDER — IBUPROFEN 800 MG PO TABS
800.0000 mg | ORAL_TABLET | Freq: Three times a day (TID) | ORAL | 1 refills | Status: AC | PRN
Start: 2023-05-14 — End: ?

## 2023-05-14 NOTE — Discharge Instructions (Signed)
 Follow-up with your family doctor next week for recheck.

## 2023-05-14 NOTE — ED Triage Notes (Addendum)
 Patient presents to ED via POV, reports she was working today at [employer], something slippery was on the floor and she slipped and fell. C/o pain in left shoulder and left knee
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
## 2024-01-20 ENCOUNTER — Ambulatory Visit (INDEPENDENT_AMBULATORY_CARE_PROVIDER_SITE_OTHER)

## 2024-01-20 ENCOUNTER — Encounter: Payer: Self-pay | Admitting: Emergency Medicine

## 2024-01-20 ENCOUNTER — Ambulatory Visit
Admission: EM | Admit: 2024-01-20 | Discharge: 2024-01-20 | Disposition: A | Discharge status: Home / Self Care | Payer: Self-pay | Attending: Nurse Practitioner | Admitting: Nurse Practitioner

## 2024-01-20 DIAGNOSIS — M25522 Pain in left elbow: Secondary | ICD-10-CM

## 2024-01-20 DIAGNOSIS — M25512 Pain in left shoulder: Secondary | ICD-10-CM

## 2024-01-20 MED ORDER — KETOROLAC TROMETHAMINE 30 MG/ML IJ SOLN
30.0000 mg | Freq: Once | INTRAMUSCULAR | Status: AC
  Administered 2024-01-20: 30 mg via INTRAMUSCULAR

## 2024-01-20 MED ORDER — IBUPROFEN 800 MG PO TABS: 800.0000 mg | ORAL_TABLET | Freq: Three times a day (TID) | ORAL | 0 refills | Status: AC

## 2024-01-20 MED ORDER — TIZANIDINE HCL 4 MG PO TABS: 4.0000 mg | ORAL_TABLET | Freq: Three times a day (TID) | ORAL | 0 refills | Status: AC | PRN
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
## 2024-01-20 NOTE — Discharge Instructions (Signed)
 The x-rays of your left shoulder and left elbow are pending.  You will be contacted if the pending x-rays are abnormal.  You will also have access to your results via MyChart. You were given an injection of Toradol  30 mg.  Do not take any additional NSAIDs today to include ibuprofen , Aleve , Motrin , naproxen , or Advil .  You may take Tylenol  for breakthrough pain. A sling has been provided to allow for support.  As discussed, you need to remove the sling several times during the day and move your shoulder around to help decrease your recovery time. RICE therapy, rest, ice, compression, and elevation.  Apply ice for 20 minutes, remove for 1 hour, repeat as needed.  You may also use heat in the same manner. Gentle range of motion exercises throughout the day to help improve your symptoms. As discussed, if symptoms do not improve over the next 1 to 2 weeks, recommend follow-up with orthopedics for further evaluation. Follow-up as needed.
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
